# Patient Record
Sex: Female | Born: 1945 | Race: White | Hispanic: No | Marital: Married | Smoking: Never smoker
Health system: Southern US, Community
[De-identification: ages and names within clinical notes are randomized; demographics above are authoritative.]

## PROBLEM LIST (undated history)

## (undated) DIAGNOSIS — E119 Type 2 diabetes mellitus without complications: Secondary | ICD-10-CM

## (undated) DIAGNOSIS — I447 Left bundle-branch block, unspecified: Secondary | ICD-10-CM

## (undated) DIAGNOSIS — I10 Essential (primary) hypertension: Secondary | ICD-10-CM

## (undated) DIAGNOSIS — M8580 Other specified disorders of bone density and structure, unspecified site: Secondary | ICD-10-CM

## (undated) DIAGNOSIS — E785 Hyperlipidemia, unspecified: Secondary | ICD-10-CM

## (undated) DIAGNOSIS — I429 Cardiomyopathy, unspecified: Secondary | ICD-10-CM

## (undated) DIAGNOSIS — Z923 Personal history of irradiation: Secondary | ICD-10-CM

## (undated) DIAGNOSIS — M1990 Unspecified osteoarthritis, unspecified site: Secondary | ICD-10-CM

## (undated) DIAGNOSIS — K649 Unspecified hemorrhoids: Secondary | ICD-10-CM

## (undated) DIAGNOSIS — H3530 Unspecified macular degeneration: Secondary | ICD-10-CM

## (undated) DIAGNOSIS — I2510 Atherosclerotic heart disease of native coronary artery without angina pectoris: Secondary | ICD-10-CM

## (undated) DIAGNOSIS — C50919 Malignant neoplasm of unspecified site of unspecified female breast: Secondary | ICD-10-CM

## (undated) DIAGNOSIS — I509 Heart failure, unspecified: Secondary | ICD-10-CM

## (undated) DIAGNOSIS — K759 Inflammatory liver disease, unspecified: Secondary | ICD-10-CM

## (undated) DIAGNOSIS — R011 Cardiac murmur, unspecified: Secondary | ICD-10-CM

## (undated) DIAGNOSIS — Z95810 Presence of automatic (implantable) cardiac defibrillator: Secondary | ICD-10-CM

## (undated) HISTORY — DX: Left bundle-branch block, unspecified: I44.7

## (undated) HISTORY — PX: ABDOMINAL HYSTERECTOMY: SHX81

## (undated) HISTORY — PX: COLONOSCOPY: SHX174

## (undated) HISTORY — PX: EYE SURGERY: SHX253

## (undated) HISTORY — PX: MEDIAL PARTIAL KNEE REPLACEMENT: SHX5965

## (undated) HISTORY — DX: Essential (primary) hypertension: I10

## (undated) HISTORY — PX: INSERTION OF ICD: SHX6689

## (undated) HISTORY — DX: Type 2 diabetes mellitus without complications: E11.9

---

## 1979-06-25 DIAGNOSIS — K759 Inflammatory liver disease, unspecified: Secondary | ICD-10-CM

## 1979-06-25 DIAGNOSIS — B159 Hepatitis A without hepatic coma: Secondary | ICD-10-CM

## 1979-06-25 HISTORY — DX: Hepatitis a without hepatic coma: B15.9

## 1979-06-25 HISTORY — DX: Inflammatory liver disease, unspecified: K75.9

## 2002-06-24 DIAGNOSIS — C50912 Malignant neoplasm of unspecified site of left female breast: Secondary | ICD-10-CM

## 2002-06-24 DIAGNOSIS — C50919 Malignant neoplasm of unspecified site of unspecified female breast: Secondary | ICD-10-CM

## 2002-06-24 DIAGNOSIS — Z923 Personal history of irradiation: Secondary | ICD-10-CM

## 2002-06-24 HISTORY — PX: BREAST LUMPECTOMY: SHX2

## 2002-06-24 HISTORY — PX: BREAST BIOPSY: SHX20

## 2002-06-24 HISTORY — DX: Personal history of irradiation: Z92.3

## 2002-06-24 HISTORY — DX: Malignant neoplasm of unspecified site of unspecified female breast: C50.919

## 2002-06-24 HISTORY — DX: Malignant neoplasm of unspecified site of left female breast: C50.912

## 2005-05-31 ENCOUNTER — Ambulatory Visit: Payer: Self-pay | Admitting: Unknown Physician Specialty

## 2007-01-16 ENCOUNTER — Ambulatory Visit: Payer: Self-pay | Admitting: Unknown Physician Specialty

## 2007-06-25 DIAGNOSIS — I429 Cardiomyopathy, unspecified: Secondary | ICD-10-CM

## 2007-06-25 HISTORY — DX: Cardiomyopathy, unspecified: I42.9

## 2007-08-03 ENCOUNTER — Ambulatory Visit: Payer: Self-pay | Admitting: Internal Medicine

## 2007-08-03 HISTORY — PX: CARDIAC CATHETERIZATION: SHX172

## 2007-08-07 ENCOUNTER — Ambulatory Visit: Payer: Self-pay | Admitting: Family Medicine

## 2007-09-28 ENCOUNTER — Ambulatory Visit: Payer: Self-pay | Admitting: Unknown Physician Specialty

## 2007-09-28 ENCOUNTER — Other Ambulatory Visit: Payer: Self-pay

## 2007-10-08 ENCOUNTER — Inpatient Hospital Stay: Payer: Self-pay | Admitting: Unknown Physician Specialty

## 2009-02-23 DIAGNOSIS — Z95810 Presence of automatic (implantable) cardiac defibrillator: Secondary | ICD-10-CM

## 2009-02-23 HISTORY — PX: INSERTION OF ICD: SHX6689

## 2009-02-23 HISTORY — DX: Presence of automatic (implantable) cardiac defibrillator: Z95.810

## 2010-08-13 ENCOUNTER — Ambulatory Visit: Payer: Self-pay | Admitting: Family Medicine

## 2010-10-19 ENCOUNTER — Ambulatory Visit: Payer: Self-pay | Admitting: Unknown Physician Specialty

## 2010-10-24 LAB — PATHOLOGY REPORT

## 2011-06-11 ENCOUNTER — Ambulatory Visit: Payer: Self-pay | Admitting: Ophthalmology

## 2011-07-02 ENCOUNTER — Ambulatory Visit: Payer: Self-pay | Admitting: Ophthalmology

## 2011-07-22 ENCOUNTER — Ambulatory Visit: Payer: Self-pay | Admitting: Ophthalmology

## 2011-07-22 LAB — POTASSIUM: Potassium: 3.8 mmol/L (ref 3.5–5.1)

## 2011-07-30 ENCOUNTER — Ambulatory Visit: Payer: Self-pay | Admitting: Ophthalmology

## 2011-09-02 ENCOUNTER — Ambulatory Visit: Payer: Self-pay | Admitting: Family Medicine

## 2013-05-05 ENCOUNTER — Ambulatory Visit: Payer: Self-pay | Admitting: Family Medicine

## 2014-02-08 DIAGNOSIS — E782 Mixed hyperlipidemia: Secondary | ICD-10-CM | POA: Insufficient documentation

## 2014-05-06 ENCOUNTER — Ambulatory Visit: Payer: Self-pay | Admitting: Family Medicine

## 2014-08-06 DIAGNOSIS — R001 Bradycardia, unspecified: Secondary | ICD-10-CM | POA: Insufficient documentation

## 2014-08-16 DIAGNOSIS — R0681 Apnea, not elsewhere classified: Secondary | ICD-10-CM | POA: Insufficient documentation

## 2014-08-16 DIAGNOSIS — I071 Rheumatic tricuspid insufficiency: Secondary | ICD-10-CM | POA: Insufficient documentation

## 2014-10-16 NOTE — Op Note (Signed)
PATIENT NAME:  Teresa Levy, ROCQUE MR#:  [id number] DATE OF BIRTH:  18-Dec-1945  DATE OF PROCEDURE:  07/02/2011  PREOPERATIVE DIAGNOSIS: Visually significant cataract of the left eye.   POSTOPERATIVE DIAGNOSIS: Visually significant cataract of the left eye.   OPERATIVE PROCEDURE: Cataract extraction by phacoemulsification with implant of intraocular lens to left eye.   SURGEON: Birder Robson, MD.   ANESTHESIA:  1. Managed anesthesia care.  2. Topical tetracaine drops followed by 2% Xylocaine jelly applied in the preoperative holding area.   COMPLICATIONS: None.   TECHNIQUE: Stop and chop.   DESCRIPTION OF PROCEDURE: The patient was examined and consented in the preoperative holding area where the aforementioned topical anesthesia was applied to the left eye and then brought back to the Operating Room where the left eye was prepped and draped in the usual sterile ophthalmic fashion and a lid speculum was placed. A paracentesis was created with the side port blade and the anterior chamber was filled with viscoelastic. A near clear corneal incision was performed with the steel keratome. A continuous curvilinear capsulorrhexis was performed with a cystotome followed by the capsulorrhexis forceps. Hydrodissection and hydrodelineation were carried out with BSS on a blunt cannula. The lens was removed in a stop and chop technique and the remaining cortical material was removed with the irrigation-aspiration handpiece. The capsular bag was inflated with viscoelastic and the Technis ZCB00 18.5-diopter lens, serial number [id number] was placed in the capsular bag without complication. The remaining viscoelastic was removed from the eye with the irrigation-aspiration handpiece. The wounds were hydrated. The anterior chamber was flushed with Miostat and the eye was inflated to physiologic pressure. The wounds were found to be water tight. The eye was dressed with Vigamox and Omnipred. The patient was given  protective glasses to wear throughout the day and a shield with which to sleep tonight. The patient was also given drops with which to begin a drop regimen today and will follow-up with me in one day.   ____________________________ Livingston Diones. Darcee Dekker, MD wlp:ap D: 07/02/2011 13:50:39 ET T: 07/02/2011 14:10:17 ET JOB#: [id number]  cc: Winfred Redel L. Eastin Swing, MD, <Dictator> Livingston Diones Soul Deveney MD ELECTRONICALLY SIGNED 07/09/2011 12:18

## 2014-10-16 NOTE — Op Note (Signed)
PATIENT NAME:  Teresa Levy, Teresa Levy MR#:  [id number] DATE OF BIRTH:  09-02-1945  DATE OF PROCEDURE:  07/30/2011  PREOPERATIVE DIAGNOSIS: Visually significant cataract of the right eye.   POSTOPERATIVE DIAGNOSIS: Visually significant cataract of the right eye.   OPERATIVE PROCEDURE: Cataract extraction by phacoemulsification with implant of intraocular lens to the right eye.   SURGEON: Birder Robson, MD.   ANESTHESIA:  1. Managed anesthesia care.  2. Topical tetracaine drops followed by 2% Xylocaine jelly applied in the preoperative holding area.   COMPLICATIONS: None.   TECHNIQUE:  Stop and chop.   DESCRIPTION OF PROCEDURE: The patient was examined and consented in the preoperative holding area where the aforementioned topical anesthesia was applied to the right eye and then brought back to the Operating Room where the right eye was prepped and draped in the usual sterile ophthalmic fashion and a lid speculum was placed. A paracentesis was created with the side port blade and the anterior chamber was filled with viscoelastic. A near clear corneal incision was performed with the steel keratome. A continuous curvilinear capsulorrhexis was performed with a cystotome followed by the capsulorrhexis forceps. Hydrodissection and hydrodelineation were carried out with BSS on a blunt cannula. The lens was removed in a stop and chop technique and the remaining cortical material was removed with the irrigation-aspiration handpiece. The capsular bag was inflated with viscoelastic and the Tecnis ZCB00 18.5-diopter lens, serial number [id number] was placed in the capsular bag without complication. The remaining viscoelastic was removed from the eye with the irrigation-aspiration handpiece. The wounds were hydrated. The anterior chamber was flushed with Miostat and the eye was inflated to physiologic pressure. The wounds were found to be water tight. The eye was dressed with Vigamox and Omnipred. The patient  was given protective glasses to wear throughout the day and a shield with which to sleep tonight. The patient was also given drops with which to begin a drop regimen today and will follow up with me in one day.     ____________________________ Livingston Diones. Demetri Goshert, MD wlp:bjt D: 07/30/2011 13:04:22 ET T: 07/30/2011 13:29:56 ET JOB#: [id number]  cc: Maui Ahart L. Taler Kushner, MD, <Dictator> Livingston Diones Ruthia Person MD ELECTRONICALLY SIGNED 08/06/2011 12:47

## 2014-11-11 ENCOUNTER — Encounter: Payer: Medicare PPO | Attending: Family Medicine | Admitting: *Deleted

## 2014-11-11 ENCOUNTER — Encounter: Payer: Self-pay | Admitting: *Deleted

## 2014-11-11 VITALS — BP 136/78 | Ht 61.0 in | Wt 199.3 lb

## 2014-11-11 DIAGNOSIS — E119 Type 2 diabetes mellitus without complications: Secondary | ICD-10-CM | POA: Diagnosis not present

## 2014-11-11 NOTE — Patient Instructions (Signed)
Check blood sugars 2 x day before breakfast and 2 hrs after supper every day  Exercise: Begin walking for   15  minutes   3  days a week   Eat 3 meals day,   1-2  snacks a day Space meals 4-6 hours apart  Bring blood sugar records to the next appointment/class  Call your doctor for a prescription for:  1. Meter strips (type)  One Touch Verio  checking  2   times per day  2. Lancets (type)  One Touch Delica  checking  2   times per day  Return for appointment/classes on:

## 2014-11-11 NOTE — Progress Notes (Signed)
Diabetes Self-Management Education  Visit Type: First/Initial  Appt. Start Time: 1000 Appt. End Time: 5456  11/11/2014  Teresa Levy, identified by name and date of birth, is a 69 y.o. female with a diagnosis of Diabetes: Type 2.  Other people present during visit:  Spouse  ASSESSMENT  Blood pressure 136/78, height 5\' 1"  (1.549 m), weight 199 lb 4.8 oz (90.402 kg). Body mass index is 37.68 kg/(m^2).  Initial Visit Information:  Are you currently following a meal plan?: Yes What type of meal plan do you follow?: nothing specific but has stopped drinking [employer] sweet tea Are you taking your medications as prescribed?: Yes Are you checking your feet?: No   How often do you need to have someone help you when you read instructions, pamphlets, or other written materials from your doctor or pharmacy?: 1 - Never What is the last grade level you completed in school?: 12 + college  Psychosocial:  Patient Belief/Attitude about Diabetes: Afraid (also reports angry and frustrated) Self-care barriers: None Self-management support: Family, Friends Other persons present: Spouse/SO Patient Concerns: Nutrition/Meal planning, Monitoring, Problem Solving, Glycemic Control, Weight Control, Healthy Lifestyle, Medication Special Needs: None Preferred Learning Style: Auditory, Visual, Hands on Learning Readiness: Ready  Complications:   Last HgB A1C per patient/outside source: 8 mg/dL (10/31/14) How often do you check your blood sugar?: 0 times/day (not testing)  Provided One Touch Verio Meter and instructed on use. BG upon return demonstration was 131 mg/dL fasting. Have you had a dilated eye exam in the past 12 months?: Yes Have you had a dental exam in the past 12 months?: Yes  Diet Intake:  Breakfast: skipping breakfast - just drinking coffee  Exercise:  Exercise: ADL's  Individualized Plan for Diabetes Self-Management Training:   Learning Objective:  Patient will have a  greater understanding of diabetes self-management.   Education Topics Reviewed with Patient Today:  Definition of diabetes, type 1 and 2, and the diagnosis of diabetes, Factors that contribute to the development of diabetes Role of diet in the treatment of diabetes and the relationship between the three main macronutrients and blood glucose level, Food label reading, portion sizes and measuring food. Role of exercise on diabetes management, blood pressure control and cardiac health. Reviewed patients medication for diabetes, action, purpose, timing of dose and side effects. (Metformin) Taught/evaluated SMBG meter., Purpose and frequency of SMBG., Identified appropriate SMBG and/or A1C goals.   Relationship between chronic complications and blood glucose control Identified and addressed patients feelings and concerns about diabetes (Pt was crying during assessment when checking her blood sugar)      PATIENTS GOALS/Plan (Developed by the patient): Improve blood sugars Prevent diabetes complications Lose weight Lead a healthier lifestyle Stay off insulin shots  Plan:   Patient Instructions  Check blood sugars 2 x day before breakfast and 2 hrs after supper every day  Exercise: Begin walking for   15  minutes   3  days a week   Eat 3 meals day,   1-2  snacks a day Space meals 4-6 hours apart Don't skip meals  Bring blood sugar records to the next appointment/class  Call your doctor for a prescription for:  1. Meter strips (type)  One Touch Verio  checking  2   times per day  2. Lancets (type)  One Touch Delica  checking  2   times per day  Expected Outcomes:  Demonstrated interest in learning. Expect positive outcomes  Education material provided: General Meal Planning Guidelines;  One Touch Verio Flex meter  If problems or questions, patient to contact team via:  Johny Drilling, [place], [place], CDE [phone]  Future DSME appointment: June 2 for Class 1

## 2014-11-14 ENCOUNTER — Telehealth: Payer: Self-pay | Admitting: *Deleted

## 2014-11-14 NOTE — Telephone Encounter (Signed)
Received call from patient. She reports that her insurance will not cover the glucometer that she was given. Pt will come in tomorrow at 3:15 pm to receive new glucometer.

## 2014-11-15 ENCOUNTER — Encounter: Payer: Self-pay | Admitting: *Deleted

## 2014-11-15 NOTE — Progress Notes (Signed)
Pt came by to pick up another glucometer. Her insurance doesn't cover One Touch. Gave her Accu-Check Nano and demonstrated use.

## 2014-11-24 ENCOUNTER — Encounter: Payer: Medicare PPO | Attending: Family Medicine | Admitting: Dietician

## 2014-11-24 VITALS — Ht 61.0 in | Wt 198.0 lb

## 2014-11-24 DIAGNOSIS — E119 Type 2 diabetes mellitus without complications: Secondary | ICD-10-CM | POA: Insufficient documentation

## 2014-11-24 DIAGNOSIS — Z87898 Personal history of other specified conditions: Secondary | ICD-10-CM | POA: Insufficient documentation

## 2014-11-24 DIAGNOSIS — H35349 Macular cyst, hole, or pseudohole, unspecified eye: Secondary | ICD-10-CM | POA: Insufficient documentation

## 2014-11-24 DIAGNOSIS — I10 Essential (primary) hypertension: Secondary | ICD-10-CM | POA: Insufficient documentation

## 2014-11-24 NOTE — Progress Notes (Signed)

## 2014-12-01 ENCOUNTER — Encounter: Payer: Self-pay | Admitting: *Deleted

## 2014-12-01 ENCOUNTER — Encounter: Payer: Medicare PPO | Admitting: *Deleted

## 2014-12-01 VITALS — Wt 195.2 lb

## 2014-12-01 DIAGNOSIS — E119 Type 2 diabetes mellitus without complications: Secondary | ICD-10-CM

## 2014-12-01 NOTE — Progress Notes (Signed)

## 2014-12-08 ENCOUNTER — Encounter: Payer: Medicare PPO | Admitting: Dietician

## 2014-12-08 VITALS — BP 116/64 | Ht 61.0 in | Wt 195.4 lb

## 2014-12-08 DIAGNOSIS — E119 Type 2 diabetes mellitus without complications: Secondary | ICD-10-CM | POA: Diagnosis not present

## 2014-12-08 NOTE — Progress Notes (Signed)

## 2014-12-12 ENCOUNTER — Encounter: Payer: Self-pay | Admitting: *Deleted

## 2015-01-30 DIAGNOSIS — E119 Type 2 diabetes mellitus without complications: Secondary | ICD-10-CM | POA: Insufficient documentation

## 2015-02-08 ENCOUNTER — Other Ambulatory Visit: Payer: Self-pay | Admitting: Family Medicine

## 2015-02-08 DIAGNOSIS — Z1239 Encounter for other screening for malignant neoplasm of breast: Secondary | ICD-10-CM

## 2015-04-17 DIAGNOSIS — Z95810 Presence of automatic (implantable) cardiac defibrillator: Secondary | ICD-10-CM | POA: Insufficient documentation

## 2015-05-04 ENCOUNTER — Ambulatory Visit: Admission: RE | Admit: 2015-05-04 | Payer: Medicare PPO | Source: Ambulatory Visit

## 2015-05-04 ENCOUNTER — Encounter
Admission: RE | Admit: 2015-05-04 | Discharge: 2015-05-04 | Disposition: A | Discharge status: Home / Self Care | Payer: Medicare PPO | Source: Ambulatory Visit | Attending: Cardiology | Admitting: Cardiology

## 2015-05-04 DIAGNOSIS — Z01812 Encounter for preprocedural laboratory examination: Secondary | ICD-10-CM | POA: Insufficient documentation

## 2015-05-04 DIAGNOSIS — Z01810 Encounter for preprocedural cardiovascular examination: Secondary | ICD-10-CM | POA: Insufficient documentation

## 2015-05-04 DIAGNOSIS — Z419 Encounter for procedure for purposes other than remedying health state, unspecified: Secondary | ICD-10-CM

## 2015-05-04 HISTORY — DX: Inflammatory liver disease, unspecified: K75.9

## 2015-05-04 HISTORY — DX: Atherosclerotic heart disease of native coronary artery without angina pectoris: I25.10

## 2015-05-04 HISTORY — DX: Hyperlipidemia, unspecified: E78.5

## 2015-05-04 HISTORY — DX: Other specified disorders of bone density and structure, unspecified site: M85.80

## 2015-05-04 LAB — BASIC METABOLIC PANEL
Anion gap: 7 (ref 5–15)
BUN: 15 mg/dL (ref 6–20)
CHLORIDE: 105 mmol/L (ref 101–111)
CO2: 29 mmol/L (ref 22–32)
CREATININE: 0.64 mg/dL (ref 0.44–1.00)
Calcium: 9.5 mg/dL (ref 8.9–10.3)
GLUCOSE: 100 mg/dL — AB (ref 65–99)
POTASSIUM: 3.8 mmol/L (ref 3.5–5.1)
Sodium: 141 mmol/L (ref 135–145)

## 2015-05-04 LAB — CBC
HEMATOCRIT: 39.5 % (ref 35.0–47.0)
Hemoglobin: 13.2 g/dL (ref 12.0–16.0)
MCH: 28.5 pg (ref 26.0–34.0)
MCHC: 33.3 g/dL (ref 32.0–36.0)
MCV: 85.5 fL (ref 80.0–100.0)
Platelets: 181 10*3/uL (ref 150–440)
RBC: 4.62 MIL/uL (ref 3.80–5.20)
RDW: 13.5 % (ref 11.5–14.5)
WBC: 5.1 10*3/uL (ref 3.6–11.0)

## 2015-05-04 LAB — DIFFERENTIAL
BASOS PCT: 1 %
Basophils Absolute: 0 10*3/uL (ref 0–0.1)
EOS ABS: 0.1 10*3/uL (ref 0–0.7)
EOS PCT: 2 %
LYMPHS PCT: 35 %
Lymphs Abs: 1.8 10*3/uL (ref 1.0–3.6)
Monocytes Absolute: 0.4 10*3/uL (ref 0.2–0.9)
Monocytes Relative: 7 %
NEUTROS PCT: 55 %
Neutro Abs: 2.8 10*3/uL (ref 1.4–6.5)

## 2015-05-04 LAB — SURGICAL PCR SCREEN
MRSA, PCR: NEGATIVE
STAPHYLOCOCCUS AUREUS: POSITIVE — AB

## 2015-05-04 NOTE — OR Nursing (Signed)
Staph + and MRSA - Faxed notification and called office

## 2015-05-04 NOTE — Patient Instructions (Signed)
  Your procedure is scheduled on: 05/23/15 Report to Day Surgery. To find out your arrival time please call [phone] between 1PM - 3PM on 05/22/15.  Remember: Instructions that are not followed completely may result in serious medical risk, up to and including death, or upon the discretion of your surgeon and anesthesiologist your surgery may need to be rescheduled.    __x__ 1. Do not eat food or drink liquids after midnight. No gum chewing or hard candies.     __x__ 2. No Alcohol for 24 hours before or after surgery.   ____ 3. Bring all medications with you on the day of surgery if instructed.    ___x_ 4. Notify your doctor if there is any change in your medical condition     (cold, fever, infections).     Do not wear jewelry, make-up, hairpins, clips or nail polish.  Do not wear lotions, powders, or perfumes. You may wear deodorant.  Do not shave 48 hours prior to surgery. Men may shave face and neck.  Do not bring valuables to the hospital.    [facility] is not responsible for any belongings or valuables.               Contacts, dentures or bridgework may not be worn into surgery.  Leave your suitcase in the car. After surgery it may be brought to your room.  For patients admitted to the hospital, discharge time is determined by your                treatment team.   Patients discharged the day of surgery will not be allowed to drive home.   Please read over the following fact sheets that you were given:   Surgical Site Infection Prevention   ____ Take these medicines the morning of surgery with A SIP OF WATER:    1. coreg  2. Bring lisinopril/hctz  3.   4.  5.  6.  ____ Fleet Enema (as directed)   _x___ Use CHG Soap as directed  ____ Use inhalers on the day of surgery  __x__ Stop metformin 2 days prior to surgery 11/26    ____ Take 1/2 of usual insulin dose the night before surgery and none on the morning of surgery.   ____ Stop Coumadin/Plavix/aspirin on as  per Dr. Lillie Fragmin  ____ Stop Anti-inflammatories on 11/22  May take tylenol   ____ Stop supplements until after surgery.  Glucosamine 11/22  ____ Bring C-Pap to the hospital.

## 2015-05-04 NOTE — OR Nursing (Signed)
Dr. Lillie Fragmin office said patient should stop ASA 81mg  7 days before the surgery

## 2015-05-08 ENCOUNTER — Ambulatory Visit
Admission: RE | Admit: 2015-05-08 | Discharge: 2015-05-08 | Disposition: A | Discharge status: Home / Self Care | Payer: Medicare PPO | Source: Ambulatory Visit | Attending: Cardiology | Admitting: Cardiology

## 2015-05-08 ENCOUNTER — Ambulatory Visit
Admission: RE | Admit: 2015-05-08 | Discharge: 2015-05-08 | Disposition: A | Discharge status: Home / Self Care | Payer: Medicare PPO | Source: Ambulatory Visit | Attending: Family Medicine | Admitting: Family Medicine

## 2015-05-08 ENCOUNTER — Other Ambulatory Visit: Payer: Self-pay | Admitting: Cardiology

## 2015-05-08 DIAGNOSIS — Z01818 Encounter for other preprocedural examination: Secondary | ICD-10-CM | POA: Diagnosis not present

## 2015-05-08 DIAGNOSIS — Z1239 Encounter for other screening for malignant neoplasm of breast: Secondary | ICD-10-CM

## 2015-05-08 DIAGNOSIS — Z1231 Encounter for screening mammogram for malignant neoplasm of breast: Secondary | ICD-10-CM | POA: Diagnosis not present

## 2015-05-08 DIAGNOSIS — Z419 Encounter for procedure for purposes other than remedying health state, unspecified: Secondary | ICD-10-CM

## 2015-05-08 HISTORY — DX: Malignant neoplasm of unspecified site of unspecified female breast: C50.919

## 2015-05-23 ENCOUNTER — Encounter: Payer: Self-pay | Admitting: *Deleted

## 2015-05-23 ENCOUNTER — Encounter
Admission: RE | Disposition: A | Discharge status: Home / Self Care | Payer: Self-pay | Source: Ambulatory Visit | Attending: Cardiology

## 2015-05-23 ENCOUNTER — Ambulatory Visit: Payer: Medicare PPO | Admitting: Certified Registered"

## 2015-05-23 ENCOUNTER — Ambulatory Visit
Admission: RE | Admit: 2015-05-23 | Discharge: 2015-05-23 | Disposition: A | Discharge status: Home / Self Care | Payer: Medicare PPO | Source: Ambulatory Visit | Attending: Cardiology | Admitting: Cardiology

## 2015-05-23 DIAGNOSIS — I5022 Chronic systolic (congestive) heart failure: Secondary | ICD-10-CM | POA: Diagnosis not present

## 2015-05-23 DIAGNOSIS — I2510 Atherosclerotic heart disease of native coronary artery without angina pectoris: Secondary | ICD-10-CM | POA: Diagnosis not present

## 2015-05-23 DIAGNOSIS — Z853 Personal history of malignant neoplasm of breast: Secondary | ICD-10-CM | POA: Insufficient documentation

## 2015-05-23 DIAGNOSIS — I071 Rheumatic tricuspid insufficiency: Secondary | ICD-10-CM | POA: Insufficient documentation

## 2015-05-23 DIAGNOSIS — Z833 Family history of diabetes mellitus: Secondary | ICD-10-CM | POA: Insufficient documentation

## 2015-05-23 DIAGNOSIS — Z803 Family history of malignant neoplasm of breast: Secondary | ICD-10-CM | POA: Insufficient documentation

## 2015-05-23 DIAGNOSIS — H35343 Macular cyst, hole, or pseudohole, bilateral: Secondary | ICD-10-CM | POA: Insufficient documentation

## 2015-05-23 DIAGNOSIS — Z8719 Personal history of other diseases of the digestive system: Secondary | ICD-10-CM | POA: Insufficient documentation

## 2015-05-23 DIAGNOSIS — R0602 Shortness of breath: Secondary | ICD-10-CM | POA: Insufficient documentation

## 2015-05-23 DIAGNOSIS — Z4502 Encounter for adjustment and management of automatic implantable cardiac defibrillator: Secondary | ICD-10-CM | POA: Diagnosis not present

## 2015-05-23 DIAGNOSIS — Z7982 Long term (current) use of aspirin: Secondary | ICD-10-CM | POA: Insufficient documentation

## 2015-05-23 DIAGNOSIS — Z820 Family history of epilepsy and other diseases of the nervous system: Secondary | ICD-10-CM | POA: Insufficient documentation

## 2015-05-23 DIAGNOSIS — I10 Essential (primary) hypertension: Secondary | ICD-10-CM | POA: Insufficient documentation

## 2015-05-23 DIAGNOSIS — M8580 Other specified disorders of bone density and structure, unspecified site: Secondary | ICD-10-CM | POA: Diagnosis not present

## 2015-05-23 DIAGNOSIS — Z96652 Presence of left artificial knee joint: Secondary | ICD-10-CM | POA: Insufficient documentation

## 2015-05-23 DIAGNOSIS — Z7984 Long term (current) use of oral hypoglycemic drugs: Secondary | ICD-10-CM | POA: Diagnosis not present

## 2015-05-23 DIAGNOSIS — Z9849 Cataract extraction status, unspecified eye: Secondary | ICD-10-CM | POA: Diagnosis not present

## 2015-05-23 DIAGNOSIS — I447 Left bundle-branch block, unspecified: Secondary | ICD-10-CM | POA: Diagnosis not present

## 2015-05-23 DIAGNOSIS — Z823 Family history of stroke: Secondary | ICD-10-CM | POA: Insufficient documentation

## 2015-05-23 DIAGNOSIS — Z8249 Family history of ischemic heart disease and other diseases of the circulatory system: Secondary | ICD-10-CM | POA: Diagnosis not present

## 2015-05-23 DIAGNOSIS — Z881 Allergy status to other antibiotic agents status: Secondary | ICD-10-CM | POA: Insufficient documentation

## 2015-05-23 DIAGNOSIS — I442 Atrioventricular block, complete: Secondary | ICD-10-CM | POA: Diagnosis present

## 2015-05-23 DIAGNOSIS — Z79899 Other long term (current) drug therapy: Secondary | ICD-10-CM | POA: Insufficient documentation

## 2015-05-23 DIAGNOSIS — I429 Cardiomyopathy, unspecified: Secondary | ICD-10-CM | POA: Diagnosis not present

## 2015-05-23 DIAGNOSIS — Z800 Family history of malignant neoplasm of digestive organs: Secondary | ICD-10-CM | POA: Insufficient documentation

## 2015-05-23 DIAGNOSIS — E785 Hyperlipidemia, unspecified: Secondary | ICD-10-CM | POA: Insufficient documentation

## 2015-05-23 DIAGNOSIS — J309 Allergic rhinitis, unspecified: Secondary | ICD-10-CM | POA: Diagnosis not present

## 2015-05-23 DIAGNOSIS — Z8262 Family history of osteoporosis: Secondary | ICD-10-CM | POA: Insufficient documentation

## 2015-05-23 HISTORY — PX: IMPLANTABLE CARDIOVERTER DEFIBRILLATOR (ICD) GENERATOR CHANGE: SHX5469

## 2015-05-23 LAB — GLUCOSE, CAPILLARY
GLUCOSE-CAPILLARY: 107 mg/dL — AB (ref 65–99)
GLUCOSE-CAPILLARY: 102 mg/dL — AB (ref 65–99)

## 2015-05-23 SURGERY — ICD GENERATOR CHANGE
Anesthesia: General | Laterality: Left

## 2015-05-23 MED ORDER — SODIUM CHLORIDE 0.9 % IR SOLN: Freq: Once | Status: DC

## 2015-05-23 MED ORDER — GLYCOPYRROLATE 0.2 MG/ML IJ SOLN
INTRAMUSCULAR | Status: DC | PRN
  Administered 2015-05-23: 0.2 mg via INTRAVENOUS

## 2015-05-23 MED ORDER — GENTAMICIN SULFATE 40 MG/ML IJ SOLN
INTRAMUSCULAR | Status: AC
  Filled 2015-05-23: qty 2

## 2015-05-23 MED ORDER — ONDANSETRON HCL 4 MG/2ML IJ SOLN: 4.0000 mg | Freq: Once | INTRAMUSCULAR | Status: DC | PRN

## 2015-05-23 MED ORDER — PROPOFOL 500 MG/50ML IV EMUL
INTRAVENOUS | Status: DC | PRN
  Administered 2015-05-23: 50 ug/kg/min via INTRAVENOUS

## 2015-05-23 MED ORDER — FAMOTIDINE 20 MG PO TABS
20.0000 mg | ORAL_TABLET | Freq: Once | ORAL | Status: AC
Start: 2015-05-23 — End: 2015-05-23
  Administered 2015-05-23: 20 mg via ORAL

## 2015-05-23 MED ORDER — FENTANYL CITRATE (PF) 100 MCG/2ML IJ SOLN
INTRAMUSCULAR | Status: DC | PRN
  Administered 2015-05-23 (×2): 50 ug via INTRAVENOUS

## 2015-05-23 MED ORDER — SODIUM CHLORIDE 0.9 % IV SOLN
INTRAVENOUS | Status: DC
  Administered 2015-05-23: 11:00:00 via INTRAVENOUS

## 2015-05-23 MED ORDER — KETAMINE HCL 10 MG/ML IJ SOLN
INTRAMUSCULAR | Status: DC | PRN
  Administered 2015-05-23: 20 mg via INTRAVENOUS

## 2015-05-23 MED ORDER — CEFAZOLIN SODIUM 1-5 GM-% IV SOLN
1.0000 g | Freq: Once | INTRAVENOUS | Status: AC
  Administered 2015-05-23: 1 g via INTRAVENOUS

## 2015-05-23 MED ORDER — LIDOCAINE 1 % OPTIME INJ - NO CHARGE
INTRAMUSCULAR | Status: DC | PRN
  Administered 2015-05-23: 30 mL

## 2015-05-23 MED ORDER — CEPHALEXIN 250 MG PO CAPS: 250.0000 mg | ORAL_CAPSULE | Freq: Four times a day (QID) | ORAL | Status: DC

## 2015-05-23 MED ORDER — LIDOCAINE HCL (CARDIAC) 20 MG/ML IV SOLN
INTRAVENOUS | Status: DC | PRN
  Administered 2015-05-23: 50 mg via INTRAVENOUS

## 2015-05-23 MED ORDER — FENTANYL CITRATE (PF) 100 MCG/2ML IJ SOLN: 25.0000 ug | INTRAMUSCULAR | Status: DC | PRN

## 2015-05-23 MED ORDER — MIDAZOLAM HCL 5 MG/5ML IJ SOLN
INTRAMUSCULAR | Status: DC | PRN
  Administered 2015-05-23: 2 mg via INTRAVENOUS

## 2015-05-23 SURGICAL SUPPLY — 44 items
BAG DECANTER FOR FLEXI CONT (MISCELLANEOUS) ×3 IMPLANT
BLADE SURG SZ10 CARB STEEL (BLADE) ×3 IMPLANT
CANISTER SUCT 1200ML W/VALVE (MISCELLANEOUS) ×3 IMPLANT
CHLORAPREP W/TINT 26ML (MISCELLANEOUS) ×3 IMPLANT
CLOSURE WOUND 1/2 X4 (GAUZE/BANDAGES/DRESSINGS) ×1
COVER LIGHT HANDLE STERIS (MISCELLANEOUS) ×6 IMPLANT
DEVICE DISSECT PLASMABLAD 3.0S (MISCELLANEOUS) ×1 IMPLANT
DRAPE C-ARM XRAY 36X54 (DRAPES) ×3 IMPLANT
DRAPE INCISE IOBAN 66X45 STRL (DRAPES) ×3 IMPLANT
DRAPE LAPAROTOMY 77X122 PED (DRAPES) ×3 IMPLANT
DRSG TEGADERM 4X4.75 (GAUZE/BANDAGES/DRESSINGS) ×3 IMPLANT
DRSG TEGADERM 6X8 (GAUZE/BANDAGES/DRESSINGS) ×3 IMPLANT
GLOVE BIO SURGEON STRL SZ8 (GLOVE) ×3 IMPLANT
GOWN STRL REUS W/ TWL LRG LVL3 (GOWN DISPOSABLE) ×1 IMPLANT
GOWN STRL REUS W/ TWL XL LVL3 (GOWN DISPOSABLE) ×1 IMPLANT
GOWN STRL REUS W/TWL LRG LVL3 (GOWN DISPOSABLE) ×2
GOWN STRL REUS W/TWL XL LVL3 (GOWN DISPOSABLE) ×2
GRADUATE 1200CC STRL 31836 (MISCELLANEOUS) ×3 IMPLANT
ICD VIVA XT CRT-D DTBA1D1 (ICD Generator) ×3 IMPLANT
IV NS 1000ML (IV SOLUTION) ×2
IV NS 1000ML BAXH (IV SOLUTION) ×1 IMPLANT
KIT RM TURNOVER STRD PROC AR (KITS) ×3 IMPLANT
KIT WRENCH (KITS) ×3 IMPLANT
NEEDLE FILTER BLUNT 18X 1/2SAF (NEEDLE) ×2
NEEDLE FILTER BLUNT 18X1 1/2 (NEEDLE) ×1 IMPLANT
NEEDLE HYPO 25X1 1.5 SAFETY (NEEDLE) ×3 IMPLANT
NEEDLE SPNL 22GX3.5 QUINCKE BK (NEEDLE) ×3 IMPLANT
NS IRRIG 500ML POUR BTL (IV SOLUTION) ×3 IMPLANT
PACK BASIN MINOR ARMC (MISCELLANEOUS) ×3 IMPLANT
PACK PACE INSERTION (MISCELLANEOUS) ×3 IMPLANT
PAD GROUND ADULT SPLIT (MISCELLANEOUS) ×3 IMPLANT
PAD STATPAD (MISCELLANEOUS) ×3 IMPLANT
PLASMABLADE 3.0S (MISCELLANEOUS) ×3
STRAP SAFETY BODY (MISCELLANEOUS) ×3 IMPLANT
STRIP CLOSURE SKIN 1/2X4 (GAUZE/BANDAGES/DRESSINGS) ×2 IMPLANT
SUT SILK 2 0 SH (SUTURE) ×3 IMPLANT
SUT VIC AB 2-0 CT1 27 (SUTURE) ×2
SUT VIC AB 2-0 CT1 TAPERPNT 27 (SUTURE) ×1 IMPLANT
SUT VIC AB 2-0 CT2 27 (SUTURE) ×3 IMPLANT
SUT VIC AB 3-0 PS2 18 (SUTURE) ×3 IMPLANT
SUT VIC AB 4-0 PS2 18 (SUTURE) ×3 IMPLANT
SYR BULB IRRIG 60ML STRL (SYRINGE) ×3 IMPLANT
SYR CONTROL 10ML (SYRINGE) ×3 IMPLANT
SYRINGE 10CC LL (SYRINGE) ×3 IMPLANT

## 2015-05-23 NOTE — Anesthesia Preprocedure Evaluation (Signed)
Anesthesia Evaluation  Patient identified by MRN, date of birth, ID band Patient awake    Reviewed: Allergy & Precautions, NPO status , Patient's Chart, lab work & pertinent test results, reviewed documented beta blocker date and time   Airway Mallampati: II  TM Distance: >3 FB     Dental  (+) Chipped   Pulmonary shortness of breath,           Cardiovascular hypertension, Pt. on medications and Pt. on home beta blockers + CAD  + dysrhythmias      Neuro/Psych    GI/Hepatic (+) Hepatitis -  Endo/Other    Renal/GU      Musculoskeletal   Abdominal   Peds  Hematology   Anesthesia Other Findings   Reproductive/Obstetrics                             Anesthesia Physical Anesthesia Plan  ASA: III  Anesthesia Plan: MAC   Post-op Pain Management:    Induction:   Airway Management Planned:   Additional Equipment:   Intra-op Plan:   Post-operative Plan:   Informed Consent: I have reviewed the patients History and Physical, chart, labs and discussed the procedure including the risks, benefits and alternatives for the proposed anesthesia with the patient or authorized representative who has indicated his/her understanding and acceptance.     Plan Discussed with: CRNA  Anesthesia Plan Comments:         Anesthesia Quick Evaluation

## 2015-05-23 NOTE — Transfer of Care (Signed)
Immediate Anesthesia Transfer of Care Note  Patient: Teresa Levy  Procedure(s) Performed: Procedure(s): ICD GENERATOR CHANGE (Left)  Patient Location: PACU  Anesthesia Type:General  Level of Consciousness: awake and alert   Airway & Oxygen Therapy: Patient Spontanous Breathing  Post-op Assessment: Report given to RN  Post vital signs: Reviewed  Last Vitals:  Filed Vitals:   05/23/15 1058 05/23/15 1340  BP: 152/92 125/85  Pulse: 68 67  Temp: 35.6 C 36.4 C  Resp: 16 16    Complications: none

## 2015-05-23 NOTE — Op Note (Signed)
[facility]   05/23/2015                     1:39 PM  PATIENT:  [name]    PRE-OPERATIVE DIAGNOSIS:  COMPLETE HEART BLOCK  POST-OPERATIVE DIAGNOSIS:  Same  PROCEDURE:  ICD GENERATOR CHANGE  SURGEON:  Isaias Cowman, MD    ANESTHESIA:     PREOPERATIVE INDICATIONS:  Teresa Levy is a  69 y.o. female with a diagnosis of [place] who failed conservative measures and elected for surgical management.    The risks benefits and alternatives were discussed with the patient preoperatively including but not limited to the risks of infection, bleeding, cardiopulmonary complications, the need for revision surgery, among others, and the patient was willing to proceed.   OPERATIVE PROCEDURE: The patient was brought to the operating room the fasting state. The left pectoral region was prepped and draped in usual sterile manner. A 6 cm incision was performed over the old ICD site. The ICD generator was retrieved by electrocautery and blunt dissection. Leads were disconnected and elected to a new the ICD device ( Viva XT CRT-D ), and positioned into the pocket. Pocket was closed with 20 and 4-0 Vicryl, respectively. Steri-Strips and a pressure dressing were applied.

## 2015-05-23 NOTE — Discharge Instructions (Signed)
AMBULATORY SURGERY  DISCHARGE INSTRUCTIONS   1) The drugs that you were given will stay in your system until tomorrow so for the next 24 hours you should not:  A) Drive an automobile B) Make any legal decisions C) Drink any alcoholic beverage   2) You may resume regular meals tomorrow.  Today it is better to start with liquids and gradually work up to solid foods.  You may eat anything you prefer, but it is better to start with liquids, then soup and crackers, and gradually work up to solid foods.   3) Please notify your doctor immediately if you have any unusual bleeding, trouble breathing, redness and pain at the surgery site, drainage, fever, or pain not relieved by medication. 4)   5) Your post-operative visit with Dr.                                     is: Date:                        Time:    Please call to schedule your post-operative visit.  6) Additional Instructions:      Cardioverter Defibrillator Implantation, Care After Refer to this sheet in the next few weeks. These instructions provide you with information on caring for yourself after your procedure. Your health care provider may also give you more specific instructions. Your treatment has been planned according to current medical practices, but problems sometimes occur. Call your health care provider if you have any problems or questions after your procedure.  WHAT TO EXPECT AFTER THE PROCEDURE You may feel pain. Some pain is normal. It may last a few days. A slight bump may be seen over the skin where the device was placed. Sometimes, it is possible to feel the device under the skin. This is normal. In the months and years afterward, your health care provider will check the device, the leads, and the battery every few months. Eventually, when the battery is low, the device will be replaced. HOME CARE INSTRUCTIONS  Medicines Take medicines only as directed by your health care provider. If you were prescribed  an antibiotic medicine, finish it all even if you start to feel better.  Do not take any other medicines without asking your health care provider first. Some medicines, including certain painkillers, can cause bleeding after surgery.  Wound Care Do not remove the bandage on your chest until directed to do so by your health care provider. Once your bandage is removed, you may see pieces of tape called skin adhesive strips over the area where the cut was made (incision site). Let them fall off on their own.  Check the incision site every day to make sure it is not infected, bleeding, or starting to pull apart. Do not use lotions or ointments near the incision site unless directed to do so.  Keep the incision area clean and dry for 2-3 days after the procedure or as directed by your health care provider. It takes several weeks for the incision site to completely heal.  Do not take baths, swim, or use a hot tub until your health care provider approves. Activities Try to walk a little every day. Exercising is important after this procedure. It is also important to use your shoulder on the side of the pacemaker in daily tasks that do not require exaggerated motion. Avoid sudden  jerking, pulling, or chopping movements that pull your upper arm far away from your body for at least 6 weeks. Do not lift your upper arm above your shoulders for at least 6 weeks. This means no tennis, golf, or swimming for this period of time. If you sleep with the arm above your head, use a restraint to prevent this from happening as you sleep. You may go back to work when your health care provider says it is okay. Check with your health care provider before you start to drive or play sports.  Other Instructions Follow diet instructions if they were provided. You should be able to eat what you usually do right away, but you may need to limit your salt intake.  Weigh yourself every day. If you suddenly gain weight, fluid  may be building up in your body.  Always carry your pacemaker identification card with you. The card should list the implant date, device model, and manufacturer. Consider wearing a medical alert bracelet or necklace. Tell all health care providers that you have a pacemaker. This may prevent them from giving you a magnetic resonance imaging (MRI) scan because of the strong magnets used during that test. If you must pass through a metal detector, quickly walk through it. Do not stop under the detector or stand near it. Avoid places or objects with a strong electric or magnetic field, including:  Engineer, maintenance. When at the airport, let officials know you have a pacemaker. Your ID card will let you be checked in a way that is safe for you and that will not damage your pacemaker. Also, do not let a security person wave a magnetic wand near your pacemaker. That can make it stop working. Power plants.  Large electrical generators.  Radiofrequency transmission towers, such as cell phone and radio towers.  Do not use amateur (ham) radio equipment or electric (arc) welding torches. Some devices are safe to use if held at least 1 foot from your pacemaker. These include power tools, lawn mowers, and speakers. If you are unsure of whether something is safe to use, ask your health care provider.  You may safely use electric blankets, heating pads, computers, and microwave ovens.  When using your cell phone, hold it to the ear opposite the pacemaker. Do not leave your cell phone in a pocket over the pacemaker.  Keep all follow-up visits as directed by your health care provider. This is how your health care provider makes sure your chest is healing the way it should. Ask your health care provider when you should come back to have your stitches or staples taken out.  Have your pacemaker checked every 3-6 months or as directed by your health care provider. Most pacemakers last for 4-8 years before a  new one is needed. SEEK MEDICAL CARE IF:  You feel one shock in your chest. You gain weight suddenly.  Your legs or feet swell more than they have before.  It feels like your heart is fluttering or skipping beats (heart palpitations). You have a fever. SEEK IMMEDIATE MEDICAL CARE IF:  You have chest pain. You feel more than one shock. You feel more short of breath than you have felt before. You feel more light-headed than you have felt before. You have problems with your incision site, such as swelling or bleeding, or it starts to open up.  You notice signs of infection around your incision site. Watch for:  Warmth.  Redness.  Worsening pain.  Swelling.  Fluid leaking from the incision site.    This information is not intended to replace advice given to you by your health care provider. Make sure you discuss any questions you have with your health care provider.   Document Released: 12/28/2004 Document Revised: 07/01/2014 Document Reviewed: 07/01/2012 Elsevier Interactive Patient Education [employer].

## 2015-05-23 NOTE — Interval H&P Note (Signed)
History and Physical Interval Note:  05/23/2015 10:17 AM  Teresa Levy  has presented today for surgery, with the diagnosis of COMPLETE HEART BLOCK  The various methods of treatment have been discussed with the patient and family. After consideration of risks, benefits and other options for treatment, the patient has consented to  Procedure(s): ICD GENERATOR CHANGE (Left) as a surgical intervention .  The patient's history has been reviewed, patient examined, no change in status, stable for surgery.  I have reviewed the patient's chart and labs.  Questions were answered to the patient's satisfaction.     Markiah Janeway

## 2015-05-23 NOTE — H&P (Signed)
Printout Information Document Contents Office Visit Document Received Date 05/08/2015 Document Source Organization [facility] Patient Demographics  Reason for Visit  Encounter Details  Last Filed Vital Signs (In this Visit)  Progress Notes  Plan of Care  Visit Diagnoses  Document Information Encounter Summary - Teresa Levy [id number] y.o. Female)As of Nov. 10, 2016 Patient Demographics Patient Address Communication Language Race / Ethnicity  [address] [place], [place] [address]  [phone] [facility]) [phone] (Home) [email or web address]  English (Preferred) Caucasian/White / Not Hispanic/Latino  Reason for Visit Reason Comments  Follow-up   Pacer-ICD eri   Cardiomyopathy   Encounter Details Date Type Department Care Team Description  05/04/2015 Office Visit [facility]  [place], [place] [address]  [phone]  Teresa Levy, [place]  [address]  [place], [place] [address]  [phone]  [phone] (Fax)  Chronic systolic CHF (congestive heart failure), NYHA class 2 (Primary Dx);Moderate tricuspid insufficiency;Benign essential hypertension  Last Filed Vital Signs (In this Visit) Vital Sign Reading Time Taken  Blood Pressure 120/80 05/04/2015 10:01 AM EST  Pulse - -  Temperature - -  Respiratory Rate 15 05/04/2015 10:01 AM EST  Oxygen Saturation - -  Weight 85.7 kg (189 lb) 05/04/2015 10:01 AM EST  Height 154.9 cm (5\' 1" ) 05/04/2015 10:01 AM EST  Body Mass Index 35.71 05/04/2015 10:01 AM EST  Progress Notes Teresa Dibble, MD - 05/04/2015 9:15 AM EST Formatting of this note may be different from the original. Established Patient Visit   Chief Complaint: Chief Complaint  Patient presents with  . Follow-up  . Pacer-ICD  eri  . Cardiomyopathy  Date of Service: 05/04/2015 Date of Birth: Oct 04, 1945 PCP: Teresa Brandy Hale, MD  History  of Present Illness: Teresa Levy is a 69 y.o.female patient  Tricuspid valve insufficiency The patient has a diagnosis of moderate non rheumatic tricuspid valve insufficiency made by echocardiogram recently. This has been associated with symptoms including shortness of breath and has occurred during climbing stairs stable at this time.There has not been associated pulmonary hypertension and has not been associated extremity edema. Etiology of the tricuspid regurgitation includes congestive heart failure, pacer wire and atrial enlargement. Current treatment includes diuretics, b-blockers and antihypertensives  Congestive heart failure The patient has chronic mild systolic heart failure secondary to Non-ischemic. Congestive Heart Failure has been manifested by severe exercise intolerance with a [place] Functional Class of II. and exertional chest pressure/discomfort. Patient appears euvolemic. and is on ACE inhibitors and beta-blocker and hydrocholorothiazide stable at this time. Defib interrogation The patient has had a pacemaker/defib interrogation which has shown that the pacemaker/defib battery has reached end of life. We have had further discussion of battery change out with all of the risks and benefits. Otherwise the patient has not had any significant side effects or symptoms of the pacemaker wires.  Past Medical and Surgical History  Past Medical History Past Medical History  Diagnosis Date  . Allergic rhinitis  . Carotid artery occlusion  . History of breast cancer  . History of cardiomyopathy  Echocardiogram 08/04/12, normal LV function, EF of 50%. Moderate MR and TR. Defibrillator, September, 2010.  Marland Kitchen History of hemorrhoids  . History of hepatitis A  . History of left bundle branch block (LBBB)  . Hypertension  . Macular hole of both eyes  h/o  . Osteopenia  . Other and unspecified hyperlipidemia   Past Surgical History She has a past surgical history that includes lens  eye  surgery (Bilateral, 2012); Macular Hole Repair (Bilateral); Defibrillator; Mastectomy partial / lumpectomy; Hysterectomy; Tonsillectomy; Left knee partial replacement; Cataract extraction; and Colonoscopy (05/31/2005, 10/19/2010).   Medications and Allergies  Current Medications  Current Outpatient Prescriptions  Medication Sig Dispense Refill  . aspirin 81 MG EC tablet Take 81 mg by mouth daily.  . blood glucose diagnostic test strip Use 2 (two) times daily. Use as instructed. 100 each 12  . CA CIT/MGOX/VIT D3/MANG/MIN AA (CALCIUM-VIT D3-MAG-MANGANESE ORAL) Take 1 tablet by mouth daily.  . calcium carbonate-vitamin D3 (OS-CAL 500+D) 500 mg(1,250mg ) -200 unit tablet Take 1 tablet by mouth once daily.  . carvedilol (COREG) 12.5 MG tablet Take 1 tablet (12.5 mg total) by mouth 2 (two) times daily with meals. 180 tablet 3  . cholecalciferol (VITAMIN D3) 1,000 unit capsule Take 1,000 Units by mouth once daily.  Marland Kitchen GLUC HCL/GLUC SU/AC-D-GLUCOS (GLUCOSAMINE COMPLEX ORAL) Take 1 tablet by mouth daily.  Marland Kitchen lancets (ONETOUCH DELICA LANCETS) 30 gauge Misc Use 1 Lancet 2 (two) times daily. 200 each 3  . lancets Use 1 each 3 (three) times daily. Use as instructed. 100 each 12  . lisinopril-hydrochlorothiazide (PRINZIDE,ZESTORETIC) 20-12.5 mg tablet TAKE 2 TABLETS ONCE A DAY 180 tablet 2  . metFORMIN (GLUCOPHAGE) 850 MG tablet Take 1 tablet (850 mg) two times a day. 180 tablet 1  . multivitamin capsule Take 1 capsule by mouth daily.   No current facility-administered medications for this visit.   Allergies: Bacitracin  Social and Family History  Social History reports that she has never smoked. She has never used smokeless tobacco. She reports that she drinks alcohol. She reports that she does not use illicit drugs.  Family History Family History  Problem Relation Age of Onset  . Hypertension Mother  . Breast cancer Mother  . Stroke Father  . Alzheimer's disease Father  . Lung cancer Father  .  Diabetes mellitus  grandparents  . Colon cancer  sibling  . Osteoporosis  grandmother  . Diabetes type II Maternal Grandmother  . Diabetes type II Maternal Grandfather  . Diabetes type II Paternal Grandmother  . Diabetes type II Paternal Grandfather   Review of Systems   Review of Systems  Positive for sob Negative for weight gain weight loss, weakness, vision change, hearing loss, cough, congestion, PND, orthopnea, nausea, diaphoresis, vomiting, diarrhea, bloody stool, melena, stomach pain, extremity pain, leg weakness, leg cramping, leg blood clots, headache, blackouts, nosebleed, trouble swallowing, mouth pain, urinary frequency, urination at night, muscle weakness, skin lesions, skin rashes, tingling ,ulcers, numbness, anxiety, and/or depression Physical Examination   Vitals: Visit Vitals  . BP 120/80 (BP Location: Right upper arm, Patient Position: Sitting, BP Cuff Size: Adult)  . Resp 15  . Ht 154.9 cm (5\' 1" )  . Wt 85.7 kg (189 lb)  . BMI 35.71 kg/m2   Ht:154.9 cm (5\' 1" ) Wt:85.7 kg (189 lb) [id number] surface area is 1.92 meters squared. Body mass index is 35.71 kg/(m^2). Appearance: well appearing in no acute distress HEENT: Pupils equally reactive to light and accomodation, no xanthalasma  Neck: Levy, masses, or lymphadenopathy  Lungs: normal respiratory effort; no crackles, no rhonchi, no wheezes Heart: Regular rate and rhythm. Normal S1 S2 No gallops, murmur, no rub, PMI is normal size and placement. carotid upstroke normal without bruit. Jugular venous pressure is normal Abdomen: soft, nontender, not distended with normal bowel sounds. No apparent hepatosplenomegally. Abdominal aorta is normal size without bruit Extremities: no edema, no ulcers, no clubbing, no cyanosis  Peripheral Pulses: 2+ in upper extremities, 2+ femoral pulses bilaterally, 2+lower extremity  Musculoskeletal; Normal muscle tone without kyphosis Neurological: Cranial nerves intact, Oriented and  Alert  Assessment   69 y.o. female with  Encounter Diagnoses  Name Primary?  . Chronic systolic CHF (congestive heart failure), NYHA class 2 Yes  . Moderate tricuspid insufficiency  . Benign essential hypertension   Plan  -the patient will have a consultation and or surgical treatment for pacemaker/defib battery end of life with battery change out. Patient understands all risks and benefits of battery change out. This includes the possibility of death, stroke, heart attack, infection, bleeding, blood clot, and or side effects of medication management for anesthesia and agrees to proceed -No further medical intervention of congestive heart failure which appears to be clinically stable at this time. The patient has been on appropriate medication management as well as using diet and exercise to improve quality of life. We have discussed moving forward that the patient would continue these measures. The patient understands to call if any new significant symptoms occurred to reduce hospitalization. -The patient will continue treatment of primary risk factors to reduce the progression of tricuspid regurgitation and its manifestations with medications listed above  No orders of the defined types were placed in this encounter.  Return in about 4 weeks (around 06/01/2015).  Teresa Dibble, MD    Plan of Care Upcoming Encounters Upcoming Encounters  Date Type Specialty Providers Description  10/20/2015 Appointment Internal Medicine Bronstein, Fuller Canada, MD  [phone]  [place], [place] [address]  [phone]  [phone] (Fax)    Visit Diagnoses   Chronic systolic CHF (congestive heart failure), NYHA class 2 - Primary   Moderate tricuspid insufficiency   Benign essential hypertension Essential hypertension, benign  Document Information Primary Care Provider Fuller Canada Bronstein (Nov. 06, 2013 - Present) [phone] (Work) [phone] (Fax)  [address], [place] [address] Document Coverage Dates Nov. 10, 2016 - Nov. 10, 2016 [place] [phone] (Work) [place], [place] [address] Encounter Providers Bruce Lenn Sink (Attending) [phone] (Work) [phone] (Fax)  [place] [facility] [place], [place] [address] Encounter Date Nov. 10, 2016 - Nov. 10, 2016 Printout Information Document Contents Office Visit Document Received Date 05/08/2015 Document Source Organization [facility] Patient Demographics  Reason for Visit  Encounter Details  Last Filed Vital Signs (In this Visit)  Progress Notes  Plan of Care  Visit Diagnoses  Document Information Encounter Summary - Teresa Levy [id number] y.o. Female)As of Nov. 10, 2016 Patient Demographics Patient Address Communication Language Race / Ethnicity  [address] [place], [place] [address]  [phone] [facility]) [phone] (Home) [email or web address]  English (Preferred) Caucasian/White / Not Hispanic/Latino  Reason for Visit Reason Comments  Follow-up   Pacer-ICD eri   Cardiomyopathy   Encounter Details Date Type Department Care Team Description  05/04/2015 Office Visit [facility]  [place], [place] [address]  [phone]  Teresa Levy, [place]  [facility]  [place], [place] [address]  [phone]  [phone] (Fax)  Chronic systolic CHF (congestive heart failure), NYHA class 2 (Primary Dx);Moderate tricuspid insufficiency;Benign essential hypertension  Last Filed Vital Signs (In this Visit) Vital Sign Reading Time Taken  Blood Pressure 120/80 05/04/2015 10:01 AM EST  Pulse - -  Temperature - -  Respiratory Rate 15 05/04/2015 10:01 AM EST  Oxygen Saturation - -  Weight 85.7 kg (189 lb) 05/04/2015  10:01 AM EST  Height 154.9 cm (5\' 1" ) 05/04/2015 10:01 AM EST  Body Mass Index 35.71  05/04/2015 10:01 AM EST  Progress Notes Teresa Dibble, MD - 05/04/2015 9:15 AM EST Formatting of this note may be different from the original. Established Patient Visit   Chief Complaint: Chief Complaint  Patient presents with  . Follow-up  . Pacer-ICD  eri  . Cardiomyopathy  Date of Service: 05/04/2015 Date of Birth: 12-11-1945 PCP: Teresa Brandy Hale, MD  History of Present Illness: Ms. Woodhall is a 69 y.o.female patient  Tricuspid valve insufficiency The patient has a diagnosis of moderate non rheumatic tricuspid valve insufficiency made by echocardiogram recently. This has been associated with symptoms including shortness of breath and has occurred during climbing stairs stable at this time.There has not been associated pulmonary hypertension and has not been associated extremity edema. Etiology of the tricuspid regurgitation includes congestive heart failure, pacer wire and atrial enlargement. Current treatment includes diuretics, b-blockers and antihypertensives  Congestive heart failure The patient has chronic mild systolic heart failure secondary to Non-ischemic. Congestive Heart Failure has been manifested by severe exercise intolerance with a [place] Functional Class of II. and exertional chest pressure/discomfort. Patient appears euvolemic. and is on ACE inhibitors and beta-blocker and hydrocholorothiazide stable at this time. Defib interrogation The patient has had a pacemaker/defib interrogation which has shown that the pacemaker/defib battery has reached end of life. We have had further discussion of battery change out with all of the risks and benefits. Otherwise the patient has not had any significant side effects or symptoms of the pacemaker wires.  Past Medical and Surgical History  Past Medical History Past Medical History  Diagnosis Date  . Allergic rhinitis  . Carotid artery occlusion  . History of breast cancer  . History of cardiomyopathy    Echocardiogram 08/04/12, normal LV function, EF of 50%. Moderate MR and TR. Defibrillator, September, 2010.  Marland Kitchen History of hemorrhoids  . History of hepatitis A  . History of left bundle branch block (LBBB)  . Hypertension  . Macular hole of both eyes  h/o  . Osteopenia  . Other and unspecified hyperlipidemia   Past Surgical History She has a past surgical history that includes lens eye surgery (Bilateral, 2012); Macular Hole Repair (Bilateral); Defibrillator; Mastectomy partial / lumpectomy; Hysterectomy; Tonsillectomy; Left knee partial replacement; Cataract extraction; and Colonoscopy (05/31/2005, 10/19/2010).   Medications and Allergies  Current Medications  Current Outpatient Prescriptions  Medication Sig Dispense Refill  . aspirin 81 MG EC tablet Take 81 mg by mouth daily.  . blood glucose diagnostic test strip Use 2 (two) times daily. Use as instructed. 100 each 12  . CA CIT/MGOX/VIT D3/MANG/MIN AA (CALCIUM-VIT D3-MAG-MANGANESE ORAL) Take 1 tablet by mouth daily.  . calcium carbonate-vitamin D3 (OS-CAL 500+D) 500 mg(1,250mg ) -200 unit tablet Take 1 tablet by mouth once daily.  . carvedilol (COREG) 12.5 MG tablet Take 1 tablet (12.5 mg total) by mouth 2 (two) times daily with meals. 180 tablet 3  . cholecalciferol (VITAMIN D3) 1,000 unit capsule Take 1,000 Units by mouth once daily.  Marland Kitchen GLUC HCL/GLUC SU/AC-D-GLUCOS (GLUCOSAMINE COMPLEX ORAL) Take 1 tablet by mouth daily.  Marland Kitchen lancets (ONETOUCH DELICA LANCETS) 30 gauge Misc Use 1 Lancet 2 (two) times daily. 200 each 3  . lancets Use 1 each 3 (three) times daily. Use as instructed. 100 each 12  . lisinopril-hydrochlorothiazide (PRINZIDE,ZESTORETIC) 20-12.5 mg tablet TAKE 2 TABLETS ONCE A DAY 180 tablet 2  . metFORMIN (GLUCOPHAGE) 850  MG tablet Take 1 tablet (850 mg) two times a day. 180 tablet 1  . multivitamin capsule Take 1 capsule by mouth daily.   No current facility-administered medications for this visit.   Allergies:  Bacitracin  Social and Family History  Social History reports that she has never smoked. She has never used smokeless tobacco. She reports that she drinks alcohol. She reports that she does not use illicit drugs.  Family History Family History  Problem Relation Age of Onset  . Hypertension Mother  . Breast cancer Mother  . Stroke Father  . Alzheimer's disease Father  . Lung cancer Father  . Diabetes mellitus  grandparents  . Colon cancer  sibling  . Osteoporosis  grandmother  . Diabetes type II Maternal Grandmother  . Diabetes type II Maternal Grandfather  . Diabetes type II Paternal Grandmother  . Diabetes type II Paternal Grandfather   Review of Systems   Review of Systems  Positive for sob Negative for weight gain weight loss, weakness, vision change, hearing loss, cough, congestion, PND, orthopnea, nausea, diaphoresis, vomiting, diarrhea, bloody stool, melena, stomach pain, extremity pain, leg weakness, leg cramping, leg blood clots, headache, blackouts, nosebleed, trouble swallowing, mouth pain, urinary frequency, urination at night, muscle weakness, skin lesions, skin rashes, tingling ,ulcers, numbness, anxiety, and/or depression Physical Examination   Vitals: Visit Vitals  . BP 120/80 (BP Location: Right upper arm, Patient Position: Sitting, BP Cuff Size: Adult)  . Resp 15  . Ht 154.9 cm (5\' 1" )  . Wt 85.7 kg (189 lb)  . BMI 35.71 kg/m2   Ht:154.9 cm (5\' 1" ) Wt:85.7 kg (189 lb) [id number] surface area is 1.92 meters squared. Body mass index is 35.71 kg/(m^2). Appearance: well appearing in no acute distress HEENT: Pupils equally reactive to light and accomodation, no xanthalasma  Neck: Levy, masses, or lymphadenopathy  Lungs: normal respiratory effort; no crackles, no rhonchi, no wheezes Heart: Regular rate and rhythm. Normal S1 S2 No gallops, murmur, no rub, PMI is normal size and placement. carotid upstroke normal without bruit. Jugular venous pressure is  normal Abdomen: soft, nontender, not distended with normal bowel sounds. No apparent hepatosplenomegally. Abdominal aorta is normal size without bruit Extremities: no edema, no ulcers, no clubbing, no cyanosis Peripheral Pulses: 2+ in upper extremities, 2+ femoral pulses bilaterally, 2+lower extremity  Musculoskeletal; Normal muscle tone without kyphosis Neurological: Cranial nerves intact, Oriented and Alert  Assessment   70 y.o. female with  Encounter Diagnoses  Name Primary?  . Chronic systolic CHF (congestive heart failure), NYHA class 2 Yes  . Moderate tricuspid insufficiency  . Benign essential hypertension   Plan  -the patient will have a consultation and or surgical treatment for pacemaker/defib battery end of life with battery change out. Patient understands all risks and benefits of battery change out. This includes the possibility of death, stroke, heart attack, infection, bleeding, blood clot, and or side effects of medication management for anesthesia and agrees to proceed -No further medical intervention of congestive heart failure which appears to be clinically stable at this time. The patient has been on appropriate medication management as well as using diet and exercise to improve quality of life. We have discussed moving forward that the patient would continue these measures. The patient understands to call if any new significant symptoms occurred to reduce hospitalization. -The patient will continue treatment of primary risk factors to reduce the progression of tricuspid regurgitation and its manifestations with medications listed above  No orders of the defined types were  placed in this encounter.  Return in about 4 weeks (around 06/01/2015).  Teresa Dibble, MD    Plan of Care Upcoming Encounters Upcoming Encounters  Date Type Specialty Providers Description  10/20/2015 Appointment Internal Medicine Bronstein, Fuller Canada, MD  [phone] [address]   [place], [place] [address]  [phone]  [phone] (Fax)    Visit Diagnoses   Chronic systolic CHF (congestive heart failure), NYHA class 2 - Primary   Moderate tricuspid insufficiency   Benign essential hypertension Essential hypertension, benign  Document Information Primary Care Provider Fuller Canada Bronstein (Nov. 06, 2013 - Present) [phone] (Work) [phone] (Fax)  [address], [place] [address] Document Coverage Dates Nov. 10, 2016 - Nov. 10, 2016 [place] [phone] (Work) [place], [place] [address] Encounter Providers Bruce Lenn Sink (Attending) [phone] (Work) [phone] (Fax)  [facility] [place], [place] [address] Encounter Date Nov. 10, 2016 - Nov. 10, 2016

## 2015-05-24 NOTE — Anesthesia Postprocedure Evaluation (Signed)
Anesthesia Post Note  Patient: Teresa Levy  Procedure(s) Performed: Procedure(s) (LRB): ICD GENERATOR CHANGE (Left)  Patient location during evaluation: PACU Level of consciousness: awake and alert Pain management: pain level controlled Vital Signs Assessment: post-procedure vital signs reviewed and stable Respiratory status: spontaneous breathing Anesthetic complications: no    Last Vitals:  Filed Vitals:   05/23/15 1425 05/23/15 1434  BP: 147/80 156/71  Pulse: 55 57  Temp:  36.7 C  Resp: 18 16    Last Pain:  Filed Vitals:   05/24/15 0827  PainSc: [place]

## 2015-06-07 DIAGNOSIS — I5022 Chronic systolic (congestive) heart failure: Secondary | ICD-10-CM | POA: Insufficient documentation

## 2016-04-23 ENCOUNTER — Other Ambulatory Visit: Payer: Self-pay | Admitting: Family Medicine

## 2016-04-23 DIAGNOSIS — Z1231 Encounter for screening mammogram for malignant neoplasm of breast: Secondary | ICD-10-CM

## 2016-05-24 ENCOUNTER — Encounter: Payer: Self-pay | Admitting: *Deleted

## 2016-05-27 ENCOUNTER — Ambulatory Visit
Admission: RE | Admit: 2016-05-27 | Discharge: 2016-05-27 | Disposition: A | Discharge status: Home / Self Care | Payer: Medicare Other | Source: Ambulatory Visit | Attending: Unknown Physician Specialty | Admitting: Unknown Physician Specialty

## 2016-05-27 ENCOUNTER — Ambulatory Visit: Payer: Medicare Other | Admitting: Certified Registered"

## 2016-05-27 ENCOUNTER — Encounter
Admission: RE | Disposition: A | Discharge status: Home / Self Care | Payer: Self-pay | Source: Ambulatory Visit | Attending: Unknown Physician Specialty

## 2016-05-27 DIAGNOSIS — I2510 Atherosclerotic heart disease of native coronary artery without angina pectoris: Secondary | ICD-10-CM | POA: Diagnosis not present

## 2016-05-27 DIAGNOSIS — M8580 Other specified disorders of bone density and structure, unspecified site: Secondary | ICD-10-CM | POA: Diagnosis not present

## 2016-05-27 DIAGNOSIS — I509 Heart failure, unspecified: Secondary | ICD-10-CM | POA: Insufficient documentation

## 2016-05-27 DIAGNOSIS — Z853 Personal history of malignant neoplasm of breast: Secondary | ICD-10-CM | POA: Diagnosis not present

## 2016-05-27 DIAGNOSIS — Z950 Presence of cardiac pacemaker: Secondary | ICD-10-CM | POA: Insufficient documentation

## 2016-05-27 DIAGNOSIS — Z7984 Long term (current) use of oral hypoglycemic drugs: Secondary | ICD-10-CM | POA: Insufficient documentation

## 2016-05-27 DIAGNOSIS — Z800 Family history of malignant neoplasm of digestive organs: Secondary | ICD-10-CM | POA: Diagnosis not present

## 2016-05-27 DIAGNOSIS — I447 Left bundle-branch block, unspecified: Secondary | ICD-10-CM | POA: Insufficient documentation

## 2016-05-27 DIAGNOSIS — D123 Benign neoplasm of transverse colon: Secondary | ICD-10-CM | POA: Diagnosis not present

## 2016-05-27 DIAGNOSIS — D125 Benign neoplasm of sigmoid colon: Secondary | ICD-10-CM | POA: Insufficient documentation

## 2016-05-27 DIAGNOSIS — Z7982 Long term (current) use of aspirin: Secondary | ICD-10-CM | POA: Diagnosis not present

## 2016-05-27 DIAGNOSIS — E785 Hyperlipidemia, unspecified: Secondary | ICD-10-CM | POA: Insufficient documentation

## 2016-05-27 DIAGNOSIS — Z79899 Other long term (current) drug therapy: Secondary | ICD-10-CM | POA: Insufficient documentation

## 2016-05-27 DIAGNOSIS — I110 Hypertensive heart disease with heart failure: Secondary | ICD-10-CM | POA: Insufficient documentation

## 2016-05-27 DIAGNOSIS — Z1211 Encounter for screening for malignant neoplasm of colon: Secondary | ICD-10-CM | POA: Insufficient documentation

## 2016-05-27 DIAGNOSIS — K640 First degree hemorrhoids: Secondary | ICD-10-CM | POA: Diagnosis not present

## 2016-05-27 HISTORY — DX: Heart failure, unspecified: I50.9

## 2016-05-27 HISTORY — PX: COLONOSCOPY WITH PROPOFOL: SHX5780

## 2016-05-27 HISTORY — DX: Cardiac murmur, unspecified: R01.1

## 2016-05-27 LAB — GLUCOSE, CAPILLARY: GLUCOSE-CAPILLARY: 152 mg/dL — AB (ref 65–99)

## 2016-05-27 SURGERY — COLONOSCOPY WITH PROPOFOL
Anesthesia: General

## 2016-05-27 MED ORDER — LIDOCAINE HCL (CARDIAC) 20 MG/ML IV SOLN
INTRAVENOUS | Status: DC | PRN
  Administered 2016-05-27: 80 mg via INTRAVENOUS

## 2016-05-27 MED ORDER — SODIUM CHLORIDE 0.9 % IV SOLN: INTRAVENOUS | Status: DC

## 2016-05-27 MED ORDER — MIDAZOLAM HCL 2 MG/2ML IJ SOLN
INTRAMUSCULAR | Status: DC | PRN
  Administered 2016-05-27: 2 mg via INTRAVENOUS

## 2016-05-27 MED ORDER — SODIUM CHLORIDE 0.9 % IV SOLN
INTRAVENOUS | Status: DC
  Administered 2016-05-27: 07:00:00 via INTRAVENOUS

## 2016-05-27 MED ORDER — EPHEDRINE SULFATE 50 MG/ML IJ SOLN
INTRAMUSCULAR | Status: DC | PRN
  Administered 2016-05-27 (×2): 5 mg via INTRAVENOUS

## 2016-05-27 MED ORDER — PIPERACILLIN-TAZOBACTAM 3.375 G IVPB 30 MIN
3.3750 g | Freq: Once | INTRAVENOUS | Status: AC
  Administered 2016-05-27: 3.375 g via INTRAVENOUS

## 2016-05-27 MED ORDER — PROPOFOL 500 MG/50ML IV EMUL
INTRAVENOUS | Status: DC | PRN
  Administered 2016-05-27: 125 ug/kg/min via INTRAVENOUS

## 2016-05-27 MED ORDER — PROPOFOL 10 MG/ML IV BOLUS
INTRAVENOUS | Status: DC | PRN
  Administered 2016-05-27: 40 mg via INTRAVENOUS
  Administered 2016-05-27: 20 mg via INTRAVENOUS

## 2016-05-27 NOTE — Anesthesia Procedure Notes (Signed)
Performed by: Brisa Auth Pre-anesthesia Checklist: Patient identified, Emergency Drugs available, Suction available, Patient being monitored and Timeout performed Patient Re-evaluated:Patient Re-evaluated prior to inductionOxygen Delivery Method: Nasal cannula Intubation Type: IV induction       

## 2016-05-27 NOTE — Transfer of Care (Signed)
Immediate Anesthesia Transfer of Care Note  Patient: Teresa Levy  Procedure(s) Performed: Procedure(s): COLONOSCOPY WITH PROPOFOL (N/A)  Patient Location: PACU  Anesthesia Type:General  Level of Consciousness: awake, alert  and responds to stimulation  Airway & Oxygen Therapy: Patient Spontanous Breathing and Patient connected to nasal cannula oxygen  Post-op Assessment: Report given to RN and Post -op Vital signs reviewed and stable  Post vital signs: Reviewed and stable  Last Vitals:  Vitals:   05/27/16 0700 05/27/16 0811  BP: 135/79 (!) 99/47  Pulse: 67 71  Resp: 18 14  Temp: 36.6 C     Last Pain:  Vitals:   05/27/16 0700  TempSrc: Tympanic         Complications: No apparent anesthesia complications

## 2016-05-27 NOTE — Op Note (Signed)
[facility] Patient Name: Teresa Levy Procedure Date: 05/27/2016 7:34 AM MRN: [id number] Account #: [id number] Date of Birth: 1946-06-24 Admit Type: Outpatient Age: 70 Room: [facility] ENDO ROOM 4 Gender: Female Note Status: Finalized Procedure:            Colonoscopy Indications:          Screening in patient at increased risk: Family history                        of 1st-degree relative with colorectal cancer Providers:            Manya Silvas, MD Referring MD:         Youlanda Roys. Lovie Macadamia, MD (Referring MD) Medicines:            Propofol per Anesthesia Complications:        No immediate complications. Procedure:            Pre-Anesthesia Assessment:                       - After reviewing the risks and benefits, the patient                        was deemed in satisfactory condition to undergo the                        procedure.                       After obtaining informed consent, the colonoscope was                        passed under direct vision. Throughout the procedure,                        the patient's blood pressure, pulse, and oxygen                        saturations were monitored continuously. The                        Colonoscope was introduced through the anus and                        advanced to the the cecum, identified by appendiceal                        orifice and ileocecal valve. The colonoscopy was                        performed without difficulty. The patient tolerated the                        procedure well. The quality of the bowel preparation                        was good. Findings:      Two sessile polyps were found in the transverse colon. The polyps were       diminutive in size. These polyps were removed with a jumbo cold forceps.       Resection and retrieval were complete.  A small polyp was found in the sigmoid colon. The polyp was sessile. The       polyp was removed with a hot  snare. A small part of it was removed with       cold forceps. Resection and retrieval were complete.      A diminutive polyp was found in the sigmoid colon. The polyp was       sessile. The polyp was removed with a jumbo cold forceps. Resection and       retrieval were complete.      Internal hemorrhoids were found during endoscopy. The hemorrhoids were       small and Grade I (internal hemorrhoids that do not prolapse). Impression:           - Two diminutive polyps in the transverse colon,                        removed with a jumbo cold forceps. Resected and                        retrieved.                       - One small polyp in the sigmoid colon, removed with a                        hot snare. Resected and retrieved.                       - One polyp.                       - One diminutive polyp in the sigmoid colon, removed                        with a jumbo cold forceps. Resected and retrieved.                       - Internal hemorrhoids. Recommendation:       - Resume regular diet indefinitely. Manya Silvas, MD 05/27/2016 8:09:28 AM This report has been signed electronically. Number of Addenda: 0 Note Initiated On: 05/27/2016 7:34 AM Scope Withdrawal Time: 0 hours 15 minutes 13 seconds  Total Procedure Duration: 0 hours 23 minutes 40 seconds       [facility]

## 2016-05-27 NOTE — H&P (Signed)
Primary Care Physician:  Juluis Pitch, MD Primary Gastroenterologist:  Dr. Vira Agar  Pre-Procedure History & Physical: HPI:  Teresa Levy is a 70 y.o. female is here for an colonoscopy.   Past Medical History:  Diagnosis Date  . Breast cancer ([place]) 2004   left breast with rad tx.   . CHF (congestive heart failure) ([place])   . Coronary artery disease   . Heart murmur   . Hepatitis 1981   Hepatitis A  . Hyperlipemia   . Hypertension   . Left bundle branch block    has defibrilator and pacemaker  . Osteopenia     Past Surgical History:  Procedure Laterality Date  . ABDOMINAL HYSTERECTOMY    . COLONOSCOPY    . EYE SURGERY Bilateral    cataract and retinal surgery  . IMPLANTABLE CARDIOVERTER DEFIBRILLATOR (ICD) GENERATOR CHANGE Left 05/23/2015   Procedure: ICD GENERATOR CHANGE;  Surgeon: Isaias Cowman, MD;  Location: [facility] ORS;  Service: Cardiovascular;  Laterality: Left;  . INSERTION OF ICD    . MASTECTOMY Left    with sentinal node  . MEDIAL PARTIAL KNEE REPLACEMENT Left     Prior to Admission medications   Medication Sig Start Date End Date Taking? Authorizing Provider  aspirin EC 81 MG tablet Take 81 mg by mouth daily.   Yes Historical Provider, MD  calcium-vitamin D (OSCAL WITH D) 500-200 MG-UNIT per tablet Take 1 tablet by mouth daily.   Yes Historical Provider, MD  carvedilol (COREG) 12.5 MG tablet Take 12.5 mg by mouth 2 (two) times daily with a meal.   Yes Historical Provider, MD  Cholecalciferol (VITAMIN D3) 1000 UNITS CAPS Take 1,000 Units by mouth daily.    Yes Historical Provider, MD  Glucosamine-Chondroit-Vit C-Mn (GLUCOSAMINE CHONDR 1500 COMPLX) CAPS Take 1 capsule by mouth daily.    Yes Historical Provider, MD  metFORMIN (GLUCOPHAGE) 850 MG tablet Take 850 mg by mouth 2 (two) times daily with a meal.  11/03/14  Yes Historical Provider, MD  Multiple Vitamin (MULTIVITAMIN) capsule Take 1 capsule by mouth daily.    Yes Historical Provider, MD   carvedilol (COREG) 12.5 MG tablet Take 1 tablet by mouth 2 (two) times daily. 08/16/14 08/16/15  Historical Provider, MD  lisinopril-hydrochlorothiazide (PRINZIDE,ZESTORETIC) 20-12.5 MG per tablet Take 2 tablets by mouth daily. 10/04/14   Historical Provider, MD    Allergies as of 04/18/2016 - Review Complete 05/23/2015  Allergen Reaction Noted  . Bacitracin Swelling 11/11/2014    Family History  Problem Relation Age of Onset  . Diabetes Father   . Diabetes Sister   . Diabetes Paternal Aunt   . Diabetes Paternal Uncle   . Diabetes Maternal Grandmother   . Breast cancer Mother 62    pt is unsure    Social History   Social History  . Marital status: Married    Spouse name: N/A  . Number of children: N/A  . Years of education: N/A   Occupational History  . Not on file.   Social History Main Topics  . Smoking status: Never Smoker  . Smokeless tobacco: Never Used  . Alcohol use 0.0 oz/week     Comment: months  . Drug use: No  . Sexual activity: No   Other Topics Concern  . Not on file   Social History Narrative  . No narrative on file    Review of Systems: See HPI, otherwise negative ROS  Physical Exam: BP 135/79   Pulse 67   Temp 97.8  F (36.6 C) (Tympanic)   Resp 18   Ht 5\' 1"  (1.549 m)   Wt 90.7 kg (200 lb)   SpO2 98%   BMI 37.79 kg/m  General:   Alert,  pleasant and cooperative in NAD Head:  Normocephalic and atraumatic. Neck:  Supple; no masses or thyromegaly. Lungs:  Clear throughout to auscultation.    Heart:  Regular rate and rhythm. Abdomen:  Soft, nontender and nondistended. Normal bowel sounds, without guarding, and without rebound.   Neurologic:  Alert and  oriented x4;  grossly normal neurologically.  Impression/Plan: [name] is here for an colonoscopy to be performed for screening due to [place] colon cancer in a sister  Risks, benefits, limitations, and alternatives regarding  colonoscopy have been reviewed with the patient.   Questions have been answered.  All parties agreeable.   Gaylyn Cheers, MD  05/27/2016, 7:30 AM

## 2016-05-27 NOTE — Anesthesia Preprocedure Evaluation (Signed)
Anesthesia Evaluation  Patient identified by MRN, date of birth, ID band Patient awake    Reviewed: Allergy & Precautions, H&P , NPO status , Patient's Chart, lab work & pertinent test results, reviewed documented beta blocker date and time   Airway Mallampati: II   Neck ROM: full    Dental  (+) Teeth Intact   Pulmonary neg pulmonary ROS,    Pulmonary exam normal        Cardiovascular Exercise Tolerance: Poor hypertension, + CAD and +CHF  (-) Orthopnea and (-) PND negative cardio ROS Normal cardiovascular exam+ dysrhythmias + Cardiac Defibrillator + Valvular Problems/Murmurs  Rhythm:regular Rate:Normal  Hx of LBBB and no previous MI No reported AICD firing. Can walk 2 blocks without difficulty   Neuro/Psych negative neurological ROS  negative psych ROS   GI/Hepatic negative GI ROS, Neg liver ROS,   Endo/Other  negative endocrine ROS  Renal/GU negative Renal ROS  negative genitourinary   Musculoskeletal   Abdominal   Peds  Hematology negative hematology ROS (+)   Anesthesia Other Findings Past Medical History: 2004: Breast cancer ([place])     Comment: left breast with rad tx.  No date: CHF (congestive heart failure) ([facility]) No date: Coronary artery disease No date: Heart murmur 1981: Hepatitis     Comment: Hepatitis A No date: Hyperlipemia No date: Hypertension No date: Left bundle branch block     Comment: has defibrilator and pacemaker No date: Osteopenia Past Surgical History: No date: ABDOMINAL HYSTERECTOMY No date: COLONOSCOPY No date: EYE SURGERY Bilateral     Comment: cataract and retinal surgery 05/23/2015: IMPLANTABLE CARDIOVERTER DEFIBRILLATOR (ICD) G* Left     Comment: Procedure: ICD GENERATOR CHANGE;  Surgeon:               Isaias Cowman, MD;  Location: [facility] ORS;                Service: Cardiovascular;  Laterality: Left; No date: INSERTION OF ICD No date: MASTECTOMY Left     Comment:  with sentinal node No date: MEDIAL PARTIAL KNEE REPLACEMENT Left BMI    Body Mass Index:  37.79 kg/m     Reproductive/Obstetrics negative OB ROS                             Anesthesia Physical Anesthesia Plan  ASA: III  Anesthesia Plan: General   Post-op Pain Management:    Induction:   Airway Management Planned:   Additional Equipment:   Intra-op Plan:   Post-operative Plan:   Informed Consent: I have reviewed the patients History and Physical, chart, labs and discussed the procedure including the risks, benefits and alternatives for the proposed anesthesia with the patient or authorized representative who has indicated his/her understanding and acceptance.   Dental Advisory Given  Plan Discussed with: CRNA  Anesthesia Plan Comments:         Anesthesia Quick Evaluation

## 2016-05-27 NOTE — Anesthesia Postprocedure Evaluation (Signed)
Anesthesia Post Note  Patient: Teresa Levy  Procedure(s) Performed: Procedure(s) (LRB): COLONOSCOPY WITH PROPOFOL (N/A)  Patient location during evaluation: PACU Anesthesia Type: General Level of consciousness: awake and alert Pain management: pain level controlled Vital Signs Assessment: post-procedure vital signs reviewed and stable Respiratory status: spontaneous breathing, nonlabored ventilation, respiratory function stable and patient connected to nasal cannula oxygen Cardiovascular status: blood pressure returned to baseline and stable Postop Assessment: no signs of nausea or vomiting Anesthetic complications: no    Last Vitals:  Vitals:   05/27/16 0830 05/27/16 0840  BP: 112/73 (!) 110/48  Pulse: 71 71  Resp: (!) 22 15  Temp:      Last Pain:  Vitals:   05/27/16 0810  TempSrc: Tympanic                 [name]

## 2016-05-28 ENCOUNTER — Encounter: Payer: Self-pay | Admitting: Unknown Physician Specialty

## 2016-05-28 LAB — SURGICAL PATHOLOGY

## 2016-05-29 ENCOUNTER — Ambulatory Visit
Admission: RE | Admit: 2016-05-29 | Discharge: 2016-05-29 | Disposition: A | Discharge status: Home / Self Care | Payer: Medicare Other | Source: Ambulatory Visit | Attending: Family Medicine | Admitting: Family Medicine

## 2016-05-29 DIAGNOSIS — Z1231 Encounter for screening mammogram for malignant neoplasm of breast: Secondary | ICD-10-CM | POA: Diagnosis not present

## 2017-01-03 DIAGNOSIS — E669 Obesity, unspecified: Secondary | ICD-10-CM | POA: Insufficient documentation

## 2017-01-15 DIAGNOSIS — Z853 Personal history of malignant neoplasm of breast: Secondary | ICD-10-CM | POA: Insufficient documentation

## 2017-01-27 ENCOUNTER — Encounter: Payer: Medicare Other | Attending: Family Medicine | Admitting: *Deleted

## 2017-01-27 ENCOUNTER — Encounter: Payer: Self-pay | Admitting: *Deleted

## 2017-01-27 VITALS — BP 118/72 | Ht 61.0 in | Wt 196.5 lb

## 2017-01-27 DIAGNOSIS — E119 Type 2 diabetes mellitus without complications: Secondary | ICD-10-CM | POA: Diagnosis present

## 2017-01-27 NOTE — Patient Instructions (Addendum)
Check blood sugars 1 x day before breakfast or 2 hrs after one meal every day Bring blood sugar records to the next MD appointment  Exercise: Begin walking for 10-15  minutes  3 days a week and gradually increase to 30 minutes 5 x week  Eat 3 meals day, 1-2 snacks a day Space meals 4-6 hours apart Don't skip meals  Make an eye doctor appointment  Carry fast acting glucose and a snack at all times (when starting Glimepiride)  Call back when you want to return for follow up appointment with nurse or dietitian

## 2017-01-27 NOTE — Progress Notes (Signed)
Diabetes Self-Management Education  Visit Type: First/Initial  Appt. Start Time: 0905 Appt. End Time: 1025  01/27/2017  Ms. Teresa Levy, identified by name and date of birth, is a 71 y.o. female with a diagnosis of Diabetes: Type 2.   ASSESSMENT  Blood pressure 118/72, height 5\' 1"  (1.549 m), weight 196 lb 8 oz (89.1 kg). Body mass index is 37.13 kg/m.      Diabetes Self-Management Education - 01/27/17 1100      Visit Information   Visit Type First/Initial     Initial Visit   Diabetes Type Type 2   Are you currently following a meal plan? No   Are you taking your medications as prescribed? Yes  She will start Glimepiride tomorrow.    Date Diagnosed 2016     Health Coping   How would you rate your overall health? Good     Psychosocial Assessment   Patient Belief/Attitude about Diabetes Other (comment)  "angry"   Self-care barriers None   Self-management support Doctor's office   Patient Concerns Nutrition/Meal planning;Medication;Monitoring;Healthy Lifestyle;Problem Solving;Glycemic Control;Weight Control   Special Needs None   Preferred Learning Style Auditory;Visual;Hands on   Learning Readiness Ready   How often do you need to have someone help you when you read instructions, pamphlets, or other written materials from your doctor or pharmacy? 1 - Never   What is the last grade level you completed in school? some college     Pre-Education Assessment   Patient understands the diabetes disease and treatment process. Needs Review   Patient understands incorporating nutritional management into lifestyle. Needs Review   Patient undertands incorporating physical activity into lifestyle. Needs Instruction   Patient understands using medications safely. Needs Instruction   Patient understands monitoring blood glucose, interpreting and using results Needs Review   Patient understands prevention, detection, and treatment of acute complications. Needs Instruction   Patient understands prevention, detection, and treatment of chronic complications. Needs Review   Patient understands how to develop strategies to address psychosocial issues. Needs Instruction   Patient understands how to develop strategies to promote health/change behavior. Needs Instruction     Complications   Last HgB A1C per patient/outside source 8.4 %  01/15/17   How often do you check your blood sugar? 0 times/day (not testing)  Pt brought her Accu-Chek Nano meter to appointment and required additional instruction. She hasn't checked her blood sugar in 1 year. Instructed on use and BG upon return demonstration was 163 mg/dL at 9:15 am - 1 hr pp.    Have you had a dilated eye exam in the past 12 months? Yes   Have you had a dental exam in the past 12 months? Yes   Are you checking your feet? Yes   How many days per week are you checking your feet? 7     Dietary Intake   Breakfast bagel wtih cream cheese; sausage biscuit; raisin toast with peanut butter; yogurt with fruit   Lunch sandwich with luncheon meat or cheese; salad   Dinner chicken, beef, shrimp, salmon, chili, spaghetti, meatloaf - peas, broccoli, brussel sprouts   Beverage(s) water, unsweetened tea     Exercise   Exercise Type ADL's     Patient Education   Previous Diabetes Education Yes (please comment)  She came to diabetes classes in 2016   Disease state  Explored patient's options for treatment of their diabetes   Nutrition management  Role of diet in the treatment of diabetes and the relationship between  the three main macronutrients and blood glucose level;Carbohydrate counting;Reviewed blood glucose goals for pre and post meals and how to evaluate the patients' food intake on their blood glucose level.;Meal timing in regards to the patients' current diabetes medication.   Physical activity and exercise  Role of exercise on diabetes management, blood pressure control and cardiac health.   Medications Reviewed  patients medication for diabetes, action, purpose, timing of dose and side effects.   Monitoring Taught/evaluated SMBG meter.;Purpose and frequency of SMBG.;Taught/discussed recording of test results and interpretation of SMBG.;Identified appropriate SMBG and/or A1C goals.;Yearly dilated eye exam   Acute complications Taught treatment of hypoglycemia - the 15 rule.   Chronic complications Relationship between chronic complications and blood glucose control   Psychosocial adjustment Identified and addressed patients feelings and concerns about diabetes     Individualized Goals (developed by patient)   Reducing Risk Improve blood sugars Decrease medications Prevent diabetes complications Lose weight Lead a healthier lifestyle Become more fit     Outcomes   Expected Outcomes Demonstrated interest in learning. Expect positive outcomes   Program Status Not Completed      Individualized Plan for Diabetes Self-Management Training:   Learning Objective:  Patient will have a greater understanding of diabetes self-management. Patient education plan is to attend individual and/or group sessions per assessed needs and concerns.   Plan:   Patient Instructions  Check blood sugars 1 x day before breakfast or 2 hrs after one meal every day Bring blood sugar records to the next MD appointment Exercise: Begin walking for 10-15  minutes  3 days a week and gradually increase to 30 minutes 5 x week Eat 3 meals day, 1-2 snacks a day Space meals 4-6 hours apart Don't skip meals Make an eye doctor appointment Carry fast acting glucose and a snack at all times (when starting Glimepiride) Call back when you want to return for follow up appointment with nurse or dietitian   Expected Outcomes:  Demonstrated interest in learning. Expect positive outcomes  Education material provided: General Meal Planning Guidelines Simple Meal Plan Symptoms, causes and treatments of Hypoglycemia  If problems or  questions, patient to contact team via:  Johny Drilling, Narcissa, CCM, CDE [phone]  Future DSME appointment:  Patient wants to work on changes and call back if she wants to schedule a follow up with the nurse of dietitian.

## 2017-04-21 ENCOUNTER — Other Ambulatory Visit: Payer: Self-pay | Admitting: Family Medicine

## 2017-04-21 DIAGNOSIS — Z1231 Encounter for screening mammogram for malignant neoplasm of breast: Secondary | ICD-10-CM

## 2017-05-22 ENCOUNTER — Telehealth: Payer: Self-pay | Admitting: *Deleted

## 2017-05-22 NOTE — Telephone Encounter (Signed)
Phone call to patient to follow up with goals set for Diabetes in August. She didn't want to return until she could work on changes. Unable to leave voice mail at this time because mailbox was full.

## 2017-07-22 ENCOUNTER — Ambulatory Visit
Admission: RE | Admit: 2017-07-22 | Discharge: 2017-07-22 | Disposition: A | Discharge status: Home / Self Care | Payer: Medicare Other | Source: Ambulatory Visit | Attending: Family Medicine | Admitting: Family Medicine

## 2017-07-22 DIAGNOSIS — Z1231 Encounter for screening mammogram for malignant neoplasm of breast: Secondary | ICD-10-CM | POA: Insufficient documentation

## 2017-09-23 DIAGNOSIS — M1711 Unilateral primary osteoarthritis, right knee: Secondary | ICD-10-CM | POA: Insufficient documentation

## 2017-12-29 ENCOUNTER — Emergency Department
Admission: EM | Admit: 2017-12-29 | Discharge: 2017-12-30 | Disposition: A | Discharge status: Home / Self Care | Payer: Medicare Other | Attending: Emergency Medicine | Admitting: Emergency Medicine

## 2017-12-29 ENCOUNTER — Other Ambulatory Visit: Payer: Self-pay

## 2017-12-29 ENCOUNTER — Encounter: Payer: Self-pay | Admitting: Emergency Medicine

## 2017-12-29 ENCOUNTER — Emergency Department: Payer: Medicare Other

## 2017-12-29 DIAGNOSIS — Z7982 Long term (current) use of aspirin: Secondary | ICD-10-CM | POA: Diagnosis not present

## 2017-12-29 DIAGNOSIS — I2510 Atherosclerotic heart disease of native coronary artery without angina pectoris: Secondary | ICD-10-CM | POA: Insufficient documentation

## 2017-12-29 DIAGNOSIS — R079 Chest pain, unspecified: Secondary | ICD-10-CM | POA: Insufficient documentation

## 2017-12-29 DIAGNOSIS — Z79899 Other long term (current) drug therapy: Secondary | ICD-10-CM | POA: Insufficient documentation

## 2017-12-29 DIAGNOSIS — I110 Hypertensive heart disease with heart failure: Secondary | ICD-10-CM | POA: Insufficient documentation

## 2017-12-29 DIAGNOSIS — Z7984 Long term (current) use of oral hypoglycemic drugs: Secondary | ICD-10-CM | POA: Diagnosis not present

## 2017-12-29 DIAGNOSIS — E119 Type 2 diabetes mellitus without complications: Secondary | ICD-10-CM | POA: Insufficient documentation

## 2017-12-29 DIAGNOSIS — I509 Heart failure, unspecified: Secondary | ICD-10-CM | POA: Diagnosis not present

## 2017-12-29 LAB — CBC
HEMATOCRIT: 36.4 % (ref 35.0–47.0)
HEMOGLOBIN: 12.3 g/dL (ref 12.0–16.0)
MCH: 29.4 pg (ref 26.0–34.0)
MCHC: 33.8 g/dL (ref 32.0–36.0)
MCV: 86.8 fL (ref 80.0–100.0)
Platelets: 187 10*3/uL (ref 150–440)
RBC: 4.2 MIL/uL (ref 3.80–5.20)
RDW: 12.9 % (ref 11.5–14.5)
WBC: 6.1 10*3/uL (ref 3.6–11.0)

## 2017-12-29 LAB — BASIC METABOLIC PANEL
Anion gap: 7 (ref 5–15)
BUN: 15 mg/dL (ref 8–23)
CHLORIDE: 105 mmol/L (ref 98–111)
CO2: 28 mmol/L (ref 22–32)
Calcium: 9.2 mg/dL (ref 8.9–10.3)
Creatinine, Ser: 0.69 mg/dL (ref 0.44–1.00)
GFR calc Af Amer: >60 mL/min (ref >60)
GFR calc non Af Amer: >60 mL/min (ref >60)
GLUCOSE: 203 mg/dL — AB (ref 70–99)
Potassium: 3.4 mmol/L — ABNORMAL LOW (ref 3.5–5.1)
SODIUM: 140 mmol/L (ref 135–145)

## 2017-12-29 LAB — TROPONIN I

## 2017-12-29 NOTE — ED Notes (Signed)
Appear in NAD, denies shob.

## 2017-12-29 NOTE — ED Triage Notes (Signed)
Pt states upper right back pain that started last pm, has been painting so she didn't think anything about it, midsternal chest tightness began about an hour ago, took a baby asa at home, has defibrillator and pacemaker.

## 2017-12-30 LAB — TROPONIN I

## 2017-12-30 NOTE — ED Provider Notes (Signed)
[facility] Emergency Department Provider Note   ____________________________________________   First MD Initiated Contact with Patient 12/29/17 2350     (approximate)  I have reviewed the triage vital signs and the nursing notes.   HISTORY  Chief Complaint Chest Pain    HPI Teresa Levy is a 72 y.o. female who comes into the hospital today with some chest tightness.  The patient has a history of coronary artery disease.  She states that the symptoms were off and on last night but she was sitting at the computer today when the tightness started in her chest.  The patient is having some mid chest pain and some pain under her right shoulder blade.  Currently she states that her pain is a 0 out of 10 has some back discomfort.  She reports that she does not have any shortness of breath but feels like she has to take a deep breath.  She denies any sweats, dizziness, lightheadedness, nausea, vomiting.  The patient came into the hospital today for evaluation of her symptoms.   Past Medical History:  Diagnosis Date  . Breast cancer ([place]) 2004   left breast with rad tx.   . CHF (congestive heart failure) ([place])   . Coronary artery disease   . Diabetes mellitus without complication ([place])   . Heart murmur   . Hepatitis 1981   Hepatitis A  . Hyperlipemia   . Hypertension   . Left bundle branch block    has defibrilator and pacemaker  . Osteopenia     Patient Active Problem List   Diagnosis Date Noted  . Essential (primary) hypertension 11/24/2014  . History of prolonged Q-T interval on ECG 11/24/2014  . Macular hole 11/24/2014  . TI (tricuspid incompetence) 08/16/2014  . Breathlessness on exertion 08/16/2014  . Bradycardia 08/06/2014  . Combined fat and carbohydrate induced hyperlipemia 02/08/2014    Past Surgical History:  Procedure Laterality Date  . ABDOMINAL HYSTERECTOMY    . 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 BREAST  LUMPECTOMY Left 2004   lumpectomy f/u with radiation, sentinal node biopsy   . COLONOSCOPY    . COLONOSCOPY WITH PROPOFOL N/A 05/27/2016   Procedure: COLONOSCOPY WITH PROPOFOL;  Surgeon: Manya Silvas, MD;  Location: [facility] ENDOSCOPY;  Service: Endoscopy;  Laterality: N/A;  . EYE SURGERY Bilateral    cataract and retinal surgery  . IMPLANTABLE CARDIOVERTER DEFIBRILLATOR (ICD) GENERATOR CHANGE Left 05/23/2015   Procedure: ICD GENERATOR CHANGE;  Surgeon: Isaias Cowman, MD;  Location: [facility] ORS;  Service: Cardiovascular;  Laterality: Left;  . INSERTION OF ICD    . MEDIAL PARTIAL KNEE REPLACEMENT Left     Prior to Admission medications   Medication Sig Start Date End Date Taking? Authorizing Provider  aspirin EC 81 MG tablet Take 81 mg by mouth daily.    [provider]  atorvastatin (LIPITOR) 20 MG tablet Take 20 mg by mouth daily. 12/09/16 12/09/17  [provider]  calcium-vitamin D (OSCAL WITH D) 500-200 MG-UNIT per tablet Take 1 tablet by mouth daily.    [provider]  carvedilol (COREG) 12.5 MG tablet Take 1 tablet by mouth 2 (two) times daily. 08/16/14 08/16/15  [provider]  Cholecalciferol (VITAMIN D3) 1000 UNITS CAPS Take 1,000 Units by mouth daily.     [provider]  escitalopram (LEXAPRO) 10 MG tablet Take 10 mg by mouth daily. 12/26/16   [provider]  glimepiride (AMARYL) 2 MG tablet  Take 2 mg by mouth daily with breakfast. 01/22/17 01/22/18  [provider]  Glucosamine-Chondroit-Vit C-Mn (GLUCOSAMINE CHONDR 1500 COMPLX) CAPS Take 1 capsule by mouth daily.     [provider]  lisinopril-hydrochlorothiazide (PRINZIDE,ZESTORETIC) 20-12.5 MG per tablet Take 2 tablets by mouth daily. 10/04/14   [provider]  metFORMIN (GLUCOPHAGE-XR) 750 MG 24 hr tablet Take 2 tablets by mouth daily with supper. 09/30/16   [provider]  Multiple Vitamin (MULTIVITAMIN) capsule Take 1 capsule by mouth  daily.     [provider]    Allergies Bacitracin and Tape  Family History  Problem Relation Age of Onset  . Diabetes Father   . Diabetes Sister   . Diabetes Paternal Aunt   . Diabetes Paternal Uncle   . Diabetes Maternal Grandmother   . Breast cancer Mother 35       pt is unsure    Social History Social History   Tobacco Use  . Smoking status: Never Smoker  . Smokeless tobacco: Never Used  Substance Use Topics  . Alcohol use: Yes    Alcohol/week: 0.0 oz    Comment: 1 per month - mixed drink  . Drug use: No    Review of Systems  Constitutional: No fever/chills Eyes: No visual changes. ENT: No sore throat. Cardiovascular:  chest pain. Respiratory: Denies shortness of breath. Gastrointestinal: No abdominal pain.  No nausea, no vomiting.   Genitourinary: Negative for dysuria. Musculoskeletal: Negative for back pain. Skin: Negative for rash. Neurological: Negative for headaches, focal weakness or numbness.   ____________________________________________   PHYSICAL EXAM:  VITAL SIGNS: ED Triage Vitals  Enc Vitals Group     BP 12/29/17 2051 (!) 160/86     Pulse Rate 12/29/17 2051 72     Resp 12/29/17 2051 19     Temp 12/29/17 2051 98.3 F (36.8 C)     Temp Source 12/29/17 2051 Oral     SpO2 12/29/17 2051 95 %     Weight 12/29/17 2051 200 lb (90.7 kg)     Height 12/29/17 2051 5\' 1"  (1.549 m)     Head Circumference --      Peak Flow --      Pain Score 12/29/17 2317 0     Pain Loc --      Pain Edu? --      Excl. in [place]? --     Constitutional: Alert and oriented. Well appearing and in mild distress. Eyes: Conjunctivae are normal. PERRL. EOMI. Head: Atraumatic. Nose: No congestion/rhinnorhea. Mouth/Throat: Mucous membranes are moist.  Oropharynx non-erythematous. Cardiovascular: Normal rate, regular rhythm.  Systolic murmur.  Good peripheral circulation. Respiratory: Normal respiratory effort.  No retractions. Lungs CTAB. Gastrointestinal: Soft  and nontender. No distention.  Positive bowel sounds Musculoskeletal: No lower extremity tenderness nor edema.   Neurologic:  Normal speech and language.  Skin:  Skin is warm, dry and intact.  Psychiatric: Mood and affect are normal.   ____________________________________________   LABS (all labs ordered are listed, but only abnormal results are displayed)  Labs Reviewed  BASIC METABOLIC PANEL - Abnormal; Notable for the following components:      Result Value   Potassium 3.4 (*)    Glucose, Bld 203 (*)    All other components within normal limits  CBC  TROPONIN I  TROPONIN I   ____________________________________________  EKG  ED ECG REPORT I, Loney Hering, the attending physician, personally viewed and interpreted this ECG.   Date: 12/29/2017  EKG Time: 2047  Rate: 72  Rhythm: Atrial sensed and ventricular paced rhythm  Axis: Normal  Intervals:none  ST&T Change: none  ____________________________________________  RADIOLOGY  ED MD interpretation: Chest x-ray: No active cardiopulmonary disease  Official radiology report(s): Dg Chest 2 View  Result Date: 12/29/2017 CLINICAL DATA:  Upper back pain since last evening. EXAM: CHEST - 2 VIEW COMPARISON:  05/08/2015 FINDINGS: ICD device projects over the left hemithorax with leads in the right atrium, right ventricle and coronary sinus similar to prior. Lungs are clear. No pneumothorax or effusion. No acute osseous abnormality. IMPRESSION: No active cardiopulmonary disease. Electronically Signed   By: Ashley Royalty M.D.   On: 12/29/2017 21:34    ____________________________________________   PROCEDURES  Procedure(s) performed: None  Procedures  Critical Care performed: No  ____________________________________________   INITIAL IMPRESSION / ASSESSMENT AND PLAN / ED COURSE  As part of my medical decision making, I reviewed the following data within the electronic MEDICAL RECORD NUMBER Notes from prior ED visits  and [place] Controlled Substance Database   This is a 72 year old female who comes into the hospital today with some chest tightness and some pain behind her shoulder blade.  The patient states that her pain is gone right now but she wanted it evaluated.  My differential diagnosis includes musculoskeletal pain, ACS, gastritis.  We did check some blood work on the patient and the patient's blood work is unremarkable.  The patient had a CBC, BMP and 2 troponins which were all negative.  The patient also had a chest x-ray which did not show any acute cause of her pain.  As her pain is 0 I did not give the patient any other medications.  The patient will be discharged home and encouraged to follow-up with her primary care physician.  She has no further questions or concerns at this time.      ____________________________________________   FINAL CLINICAL IMPRESSION(S) / ED DIAGNOSES  Final diagnoses:  Chest pain, unspecified type     ED Discharge Orders    None       Note:  This document was prepared using Dragon voice recognition software and may include unintentional dictation errors.    Loney Hering, MD 12/30/17 [phone]

## 2017-12-30 NOTE — Discharge Instructions (Addendum)
Your work-up this evening is unremarkable.  Please follow-up with your primary care physician for further evaluation of your chest pain.  Please return with any worsening conditions or any other concerns.

## 2017-12-30 NOTE — ED Notes (Signed)
Pt discharged to home.  Family member driving.  Discharge instructions reviewed.  Verbalized understanding.  No questions or concerns at this time.  Teach back verified.  Pt in NAD.  No items left in ED.   

## 2018-06-23 ENCOUNTER — Other Ambulatory Visit: Payer: Self-pay | Admitting: Family Medicine

## 2018-06-23 DIAGNOSIS — Z1231 Encounter for screening mammogram for malignant neoplasm of breast: Secondary | ICD-10-CM

## 2018-07-22 DIAGNOSIS — F419 Anxiety disorder, unspecified: Secondary | ICD-10-CM | POA: Insufficient documentation

## 2018-07-23 ENCOUNTER — Ambulatory Visit
Admission: RE | Admit: 2018-07-23 | Discharge: 2018-07-23 | Disposition: A | Discharge status: Home / Self Care | Payer: Medicare Other | Source: Ambulatory Visit | Attending: Family Medicine | Admitting: Family Medicine

## 2018-07-23 DIAGNOSIS — Z1231 Encounter for screening mammogram for malignant neoplasm of breast: Secondary | ICD-10-CM | POA: Diagnosis not present

## 2018-07-23 HISTORY — DX: Personal history of irradiation: Z92.3

## 2019-03-26 DIAGNOSIS — M8580 Other specified disorders of bone density and structure, unspecified site: Secondary | ICD-10-CM | POA: Insufficient documentation

## 2019-07-09 ENCOUNTER — Other Ambulatory Visit: Payer: Self-pay | Admitting: Family Medicine

## 2019-07-09 DIAGNOSIS — Z1231 Encounter for screening mammogram for malignant neoplasm of breast: Secondary | ICD-10-CM

## 2019-07-20 DIAGNOSIS — Z8616 Personal history of COVID-19: Secondary | ICD-10-CM

## 2019-07-20 HISTORY — DX: Personal history of COVID-19: Z86.16

## 2019-09-09 ENCOUNTER — Ambulatory Visit
Admission: RE | Admit: 2019-09-09 | Discharge: 2019-09-09 | Disposition: A | Discharge status: Home / Self Care | Payer: Medicare PPO | Source: Ambulatory Visit | Attending: Family Medicine | Admitting: Family Medicine

## 2019-09-09 DIAGNOSIS — Z1231 Encounter for screening mammogram for malignant neoplasm of breast: Secondary | ICD-10-CM | POA: Diagnosis not present

## 2019-09-15 ENCOUNTER — Other Ambulatory Visit
Admission: RE | Admit: 2019-09-15 | Discharge: 2019-09-15 | Disposition: A | Discharge status: Home / Self Care | Payer: Medicare PPO | Source: Ambulatory Visit | Attending: General Surgery | Admitting: General Surgery

## 2019-09-15 ENCOUNTER — Other Ambulatory Visit: Payer: Self-pay

## 2019-09-15 DIAGNOSIS — Z853 Personal history of malignant neoplasm of breast: Secondary | ICD-10-CM | POA: Diagnosis not present

## 2019-09-15 DIAGNOSIS — Z7982 Long term (current) use of aspirin: Secondary | ICD-10-CM | POA: Diagnosis not present

## 2019-09-15 DIAGNOSIS — Z20822 Contact with and (suspected) exposure to covid-19: Secondary | ICD-10-CM | POA: Diagnosis not present

## 2019-09-15 DIAGNOSIS — I2510 Atherosclerotic heart disease of native coronary artery without angina pectoris: Secondary | ICD-10-CM | POA: Diagnosis not present

## 2019-09-15 DIAGNOSIS — I509 Heart failure, unspecified: Secondary | ICD-10-CM | POA: Diagnosis not present

## 2019-09-15 DIAGNOSIS — Z1211 Encounter for screening for malignant neoplasm of colon: Secondary | ICD-10-CM | POA: Diagnosis not present

## 2019-09-15 DIAGNOSIS — E785 Hyperlipidemia, unspecified: Secondary | ICD-10-CM | POA: Diagnosis not present

## 2019-09-15 DIAGNOSIS — Z79899 Other long term (current) drug therapy: Secondary | ICD-10-CM | POA: Diagnosis not present

## 2019-09-15 DIAGNOSIS — I110 Hypertensive heart disease with heart failure: Secondary | ICD-10-CM | POA: Diagnosis not present

## 2019-09-15 DIAGNOSIS — M8580 Other specified disorders of bone density and structure, unspecified site: Secondary | ICD-10-CM | POA: Diagnosis not present

## 2019-09-15 DIAGNOSIS — Z95810 Presence of automatic (implantable) cardiac defibrillator: Secondary | ICD-10-CM | POA: Diagnosis not present

## 2019-09-15 DIAGNOSIS — Z86010 Personal history of colonic polyps: Secondary | ICD-10-CM | POA: Diagnosis not present

## 2019-09-15 DIAGNOSIS — Z7984 Long term (current) use of oral hypoglycemic drugs: Secondary | ICD-10-CM | POA: Diagnosis not present

## 2019-09-15 LAB — SARS CORONAVIRUS 2 (TAT 6-24 HRS): SARS Coronavirus 2: NEGATIVE

## 2019-09-16 ENCOUNTER — Encounter: Payer: Self-pay | Admitting: General Surgery

## 2019-09-17 ENCOUNTER — Encounter: Payer: Self-pay | Admitting: General Surgery

## 2019-09-17 ENCOUNTER — Ambulatory Visit: Payer: Medicare PPO | Admitting: Anesthesiology

## 2019-09-17 ENCOUNTER — Ambulatory Visit
Admission: RE | Admit: 2019-09-17 | Discharge: 2019-09-17 | Disposition: A | Discharge status: Home / Self Care | Payer: Medicare PPO | Attending: General Surgery | Admitting: General Surgery

## 2019-09-17 ENCOUNTER — Encounter
Admission: RE | Disposition: A | Discharge status: Home / Self Care | Payer: Self-pay | Source: Home / Self Care | Attending: General Surgery

## 2019-09-17 ENCOUNTER — Other Ambulatory Visit: Payer: Self-pay

## 2019-09-17 DIAGNOSIS — Z1211 Encounter for screening for malignant neoplasm of colon: Secondary | ICD-10-CM | POA: Diagnosis not present

## 2019-09-17 DIAGNOSIS — Z7982 Long term (current) use of aspirin: Secondary | ICD-10-CM | POA: Insufficient documentation

## 2019-09-17 DIAGNOSIS — E785 Hyperlipidemia, unspecified: Secondary | ICD-10-CM | POA: Insufficient documentation

## 2019-09-17 DIAGNOSIS — I2510 Atherosclerotic heart disease of native coronary artery without angina pectoris: Secondary | ICD-10-CM | POA: Insufficient documentation

## 2019-09-17 DIAGNOSIS — Z853 Personal history of malignant neoplasm of breast: Secondary | ICD-10-CM | POA: Insufficient documentation

## 2019-09-17 DIAGNOSIS — M8580 Other specified disorders of bone density and structure, unspecified site: Secondary | ICD-10-CM | POA: Insufficient documentation

## 2019-09-17 DIAGNOSIS — I110 Hypertensive heart disease with heart failure: Secondary | ICD-10-CM | POA: Insufficient documentation

## 2019-09-17 DIAGNOSIS — Z20822 Contact with and (suspected) exposure to covid-19: Secondary | ICD-10-CM | POA: Insufficient documentation

## 2019-09-17 DIAGNOSIS — Z86010 Personal history of colonic polyps: Secondary | ICD-10-CM | POA: Insufficient documentation

## 2019-09-17 DIAGNOSIS — I509 Heart failure, unspecified: Secondary | ICD-10-CM | POA: Insufficient documentation

## 2019-09-17 DIAGNOSIS — Z95810 Presence of automatic (implantable) cardiac defibrillator: Secondary | ICD-10-CM | POA: Insufficient documentation

## 2019-09-17 DIAGNOSIS — Z7984 Long term (current) use of oral hypoglycemic drugs: Secondary | ICD-10-CM | POA: Insufficient documentation

## 2019-09-17 DIAGNOSIS — Z79899 Other long term (current) drug therapy: Secondary | ICD-10-CM | POA: Insufficient documentation

## 2019-09-17 HISTORY — PX: COLONOSCOPY WITH PROPOFOL: SHX5780

## 2019-09-17 HISTORY — DX: Presence of automatic (implantable) cardiac defibrillator: Z95.810

## 2019-09-17 HISTORY — DX: Cardiomyopathy, unspecified: I42.9

## 2019-09-17 LAB — GLUCOSE, CAPILLARY: Glucose-Capillary: 124 mg/dL — ABNORMAL HIGH (ref 70–99)

## 2019-09-17 SURGERY — COLONOSCOPY WITH PROPOFOL
Anesthesia: General

## 2019-09-17 MED ORDER — PROPOFOL 500 MG/50ML IV EMUL
INTRAVENOUS | Status: AC
  Filled 2019-09-17: qty 50

## 2019-09-17 MED ORDER — PROPOFOL 10 MG/ML IV BOLUS
INTRAVENOUS | Status: DC | PRN
  Administered 2019-09-17: 20 mg via INTRAVENOUS
  Administered 2019-09-17: 10 mg via INTRAVENOUS
  Administered 2019-09-17: 50 mg via INTRAVENOUS
  Administered 2019-09-17: 30 mg via INTRAVENOUS

## 2019-09-17 MED ORDER — SODIUM CHLORIDE 0.9 % IV SOLN: INTRAVENOUS | Status: DC

## 2019-09-17 MED ORDER — PROPOFOL 500 MG/50ML IV EMUL
INTRAVENOUS | Status: DC | PRN
  Administered 2019-09-17: 125 ug/kg/min via INTRAVENOUS

## 2019-09-17 MED ORDER — PHENYLEPHRINE HCL (PRESSORS) 10 MG/ML IV SOLN
INTRAVENOUS | Status: DC | PRN
  Administered 2019-09-17: 100 ug via INTRAVENOUS

## 2019-09-17 NOTE — Transfer of Care (Signed)
Immediate Anesthesia Transfer of Care Note  Patient: Teresa Levy  Procedure(s) Performed: COLONOSCOPY WITH PROPOFOL (N/A )  Patient Location: PACU  Anesthesia Type:General  Level of Consciousness: sedated  Airway & Oxygen Therapy: Patient Spontanous Breathing and Patient connected to nasal cannula oxygen  Post-op Assessment: Report given to RN and Post -op Vital signs reviewed and stable  Post vital signs: Reviewed and stable  Last Vitals:  Vitals Value Taken Time  BP 127/62 09/17/19 0839  Temp    Pulse 70 09/17/19 0840  Resp 13 09/17/19 0840  SpO2 98 % 09/17/19 0840  Vitals shown include unvalidated device data.  Last Pain:  Vitals:   09/17/19 0735  TempSrc: Tympanic  PainSc: 0-No pain         Complications: No apparent anesthesia complications

## 2019-09-17 NOTE — Anesthesia Postprocedure Evaluation (Signed)
Anesthesia Post Note  Patient: Teresa Levy  Procedure(s) Performed: COLONOSCOPY WITH PROPOFOL (N/A )  Patient location during evaluation: PACU Anesthesia Type: General Level of consciousness: awake and alert Pain management: pain level controlled Vital Signs Assessment: post-procedure vital signs reviewed and stable Respiratory status: spontaneous breathing, nonlabored ventilation, respiratory function stable and patient connected to nasal cannula oxygen Cardiovascular status: blood pressure returned to baseline and stable Postop Assessment: no apparent nausea or vomiting Anesthetic complications: no     Last Vitals:  Vitals:   09/17/19 0839 09/17/19 0849  BP: 127/62 137/63  Pulse: 70 68  Resp: 14 17  Temp: (!) 36.4 C   SpO2: 100% 97%    Last Pain:  Vitals:   09/17/19 0849  TempSrc:   PainSc: 0-No pain                 [name]

## 2019-09-17 NOTE — H&P (Signed)
Teresa Levy [id number] [birth year]     HPI:  74 y/o woman who was identified with a sessile serrated adenoma in 2017.  For follow up exam. Tolerated prep well.   Medications Prior to Admission  Medication Sig Dispense Refill Last Dose  . linagliptin (TRADJENTA) 5 MG TABS tablet Take 5 mg by mouth daily.   09/15/2019  . lisinopril-hydrochlorothiazide (PRINZIDE,ZESTORETIC) 20-12.5 MG per tablet Take 2 tablets by mouth daily.   09/17/2019 at Unknown time  . aspirin EC 81 MG tablet Take 81 mg by mouth daily.   09/13/2019  . atorvastatin (LIPITOR) 20 MG tablet Take 20 mg by mouth daily.     . calcium-vitamin D (OSCAL WITH D) 500-200 MG-UNIT per tablet Take 1 tablet by mouth daily.   09/13/2019  . carvedilol (COREG) 12.5 MG tablet Take 1 tablet by mouth 2 (two) times daily.     . Cholecalciferol (VITAMIN D3) 1000 UNITS CAPS Take 1,000 Units by mouth daily.    09/15/2019  . escitalopram (LEXAPRO) 10 MG tablet Take 10 mg by mouth daily.   09/15/2019  . glimepiride (AMARYL) 2 MG tablet Take 2 mg by mouth daily with breakfast.     . Glucosamine-Chondroit-Vit C-Mn (GLUCOSAMINE CHONDR 1500 COMPLX) CAPS Take 1 capsule by mouth daily.    09/15/2019  . metFORMIN (GLUCOPHAGE-XR) 750 MG 24 hr tablet Take 2 tablets by mouth daily with supper.   09/15/2019  . Multiple Vitamin (MULTIVITAMIN) capsule Take 1 capsule by mouth daily.    09/17/2019   Allergies  Allergen Reactions  . Bacitracin Swelling  . Tape Rash   Past Medical History:  Diagnosis Date  . AICD (automatic cardioverter/defibrillator) present    Biventricular ICD  . Breast cancer ([place]) 2004   left breast with rad tx.   . Cardiomyopathy ([place])   . CHF (congestive heart failure) ([place])   . Coronary artery disease   . Diabetes mellitus without complication ([place])   . Heart murmur   . Hepatitis 1981   Hepatitis A  . Hyperlipemia   . Hypertension   . Left bundle branch block    has defibrilator and pacemaker  . Osteopenia   . Personal  history of radiation therapy 2004   Left breast   Past Surgical History:  Procedure Laterality Date  . ABDOMINAL HYSTERECTOMY    . 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 BREAST LUMPECTOMY Left 2004   lumpectomy f/u with radiation, sentinal node biopsy   . COLONOSCOPY    . COLONOSCOPY WITH PROPOFOL N/A 05/27/2016   Procedure: COLONOSCOPY WITH PROPOFOL;  Surgeon: Manya Silvas, MD;  Location: [facility] ENDOSCOPY;  Service: Endoscopy;  Laterality: N/A;  . EYE SURGERY Bilateral    cataract and retinal surgery  . IMPLANTABLE CARDIOVERTER DEFIBRILLATOR (ICD) GENERATOR CHANGE Left 05/23/2015   Procedure: ICD GENERATOR CHANGE;  Surgeon: Isaias Cowman, MD;  Location: [facility] ORS;  Service: Cardiovascular;  Laterality: Left;  . INSERTION OF ICD    . MEDIAL PARTIAL KNEE REPLACEMENT Left    Social History   Socioeconomic History  . Marital status: Married    Spouse name: Not on file  . Number of children: Not on file  . Years of education: Not on file  . Highest education level: Not on file  Occupational History  . Not on file  Tobacco Use  . Smoking status: Never Smoker  . Smokeless tobacco: Never Used  Substance and Sexual Activity  . Alcohol use: Yes    Alcohol/week:  0.0 standard drinks    Comment: 1 per month - mixed drink  . Drug use: No  . Sexual activity: Never  Other Topics Concern  . Not on file  Social History Narrative  . Not on file   Social Determinants of Health   Financial Resource Strain:   . Difficulty of Paying Living Expenses:   Food Insecurity:   . Worried About Charity fundraiser in the Last Year:   . Arboriculturist in the Last Year:   Transportation Needs:   . Film/video editor (Medical):   Marland Kitchen Lack of Transportation (Non-Medical):   Physical Activity:   . Days of Exercise per Week:   . Minutes of Exercise per Session:   Stress:   . Feeling of Stress :   Social Connections:   . Frequency of Communication with Friends and Family:   .  Frequency of Social Gatherings with Friends and Family:   . Attends Religious Services:   . Active Member of Clubs or Organizations:   . Attends Archivist Meetings:   Marland Kitchen Marital Status:   Intimate Partner Violence:   . Fear of Current or Ex-Partner:   . Emotionally Abused:   Marland Kitchen Physically Abused:   . Sexually Abused:    Social History   Social History Narrative  . Not on file     ROS: Negative.     PE: HEENT: Negative. Lungs: Clear. Cardio: RR.   Assessment/Plan:  Proceed with planned endoscopy   Forest Gleason [facility] 09/17/2019    .

## 2019-09-17 NOTE — Op Note (Signed)
[facility] Gastroenterology Patient Name: Teresa Levy Procedure Date: 09/17/2019 8:03 AM MRN: [id number] Account #: [id number] Date of Birth: [birth year] Admit Type: Outpatient Age: 74 Room: [facility] ENDO ROOM 1 Gender: Female Note Status: Finalized Procedure:             Colonoscopy Indications:           High risk colon cancer surveillance: Personal history                         of colonic polyps Providers:             Robert Bellow, MD Medicines:             Monitored Anesthesia Care Complications:         No immediate complications. Procedure:             Pre-Anesthesia Assessment:                        - Prior to the procedure, a History and Physical was                         performed, and patient medications, allergies and                         sensitivities were reviewed. The patient's tolerance                         of previous anesthesia was reviewed.                        - The risks and benefits of the procedure and the                         sedation options and risks were discussed with the                         patient. All questions were answered and informed                         consent was obtained.                        After obtaining informed consent, the colonoscope was                         passed under direct vision. Throughout the procedure,                         the patient's blood pressure, pulse, and oxygen                         saturations were monitored continuously. The                         Colonoscope was introduced through the anus and                         advanced to the the cecum, identified by appendiceal  orifice and ileocecal valve. The colonoscopy was                         somewhat difficult due to significant looping and a                         tortuous colon. Successful completion of the procedure                         was aided by using manual pressure. The  patient                         tolerated the procedure well. The quality of the bowel                         preparation was excellent. Findings:      The entire examined colon appeared normal on direct and retroflexion       views. Impression:            - The entire examined colon is normal on direct and                         retroflexion views.                        - No specimens collected. Recommendation:        - Repeat colonoscopy in 5 years for surveillance. Procedure Code(s):     --- Professional ---                        [phone], Colonoscopy, flexible; diagnostic, including                         collection of specimen(s) by brushing or washing, when                         performed (separate procedure) Diagnosis Code(s):     --- Professional ---                        Z86.010, Personal history of colonic polyps CPT copyright 2019 American Medical Association. All rights reserved. The codes documented in this report are preliminary and upon coder review may  be revised to meet current compliance requirements. Robert Bellow, MD 09/17/2019 8:32:55 AM This report has been signed electronically. Number of Addenda: 0 Note Initiated On: 09/17/2019 8:03 AM Scope Withdrawal Time: 0 hours 12 minutes 18 seconds  Total Procedure Duration: 0 hours 26 minutes 45 seconds       [facility]

## 2019-09-17 NOTE — Anesthesia Preprocedure Evaluation (Signed)
Anesthesia Evaluation  Patient identified by MRN, date of birth, ID band Patient awake    Reviewed: Allergy & Precautions, H&P , NPO status , Patient's Chart, lab work & pertinent test results, reviewed documented beta blocker date and time   Airway Mallampati: III   Neck ROM: full    Dental  (+) Poor Dentition   Pulmonary neg pulmonary ROS,    Pulmonary exam normal        Cardiovascular Exercise Tolerance: Poor hypertension, On Medications + CAD and +CHF  negative cardio ROS Normal cardiovascular exam+ dysrhythmias + Cardiac Defibrillator + Valvular Problems/Murmurs  Rhythm:regular Rate:Normal     Neuro/Psych negative neurological ROS  negative psych ROS   GI/Hepatic negative GI ROS, Neg liver ROS, (+) Hepatitis -  Endo/Other  negative endocrine ROSdiabetes, Well Controlled, Type 2, Oral Hypoglycemic Agents  Renal/GU negative Renal ROS  negative genitourinary   Musculoskeletal   Abdominal   Peds  Hematology negative hematology ROS (+)   Anesthesia Other Findings Past Medical History: No date: AICD (automatic cardioverter/defibrillator) present     Comment:  Biventricular ICD 2004: Breast cancer ([place])     Comment:  left breast with rad tx.  No date: Cardiomyopathy [facility]) No date: CHF (congestive heart failure) ([facility]) No date: Coronary artery disease No date: Diabetes mellitus without complication ([facility]) No date: Heart murmur 1981: Hepatitis     Comment:  Hepatitis A No date: Hyperlipemia No date: Hypertension No date: Left bundle branch block     Comment:  has defibrilator and pacemaker No date: Osteopenia 2004: Personal history of radiation therapy     Comment:  Left breast Past Surgical History: No date: ABDOMINAL HYSTERECTOMY 2004: BREAST BIOPSY; Left     Comment:  Mucinous Carcinoma 2004: BREAST LUMPECTOMY; Left     Comment:  lumpectomy f/u with radiation, sentinal node biopsy  No date:  COLONOSCOPY 05/27/2016: COLONOSCOPY WITH PROPOFOL; N/A     Comment:  Procedure: COLONOSCOPY WITH PROPOFOL;  Surgeon: Manya Silvas, MD;  Location: [facility] ENDOSCOPY;  Service:               Endoscopy;  Laterality: N/A; No date: EYE SURGERY; Bilateral     Comment:  cataract and retinal surgery 05/23/2015: IMPLANTABLE CARDIOVERTER DEFIBRILLATOR (ICD) GENERATOR  CHANGE; Left     Comment:  Procedure: ICD GENERATOR CHANGE;  Surgeon: Isaias Cowman, MD;  Location: [facility] ORS;  Service:               Cardiovascular;  Laterality: Left; No date: INSERTION OF ICD No date: MEDIAL PARTIAL KNEE REPLACEMENT; Left BMI    Body Mass Index: 36.84 kg/m     Reproductive/Obstetrics negative OB ROS                             Anesthesia Physical Anesthesia Plan  ASA: III  Anesthesia Plan: General   Post-op Pain Management:    Induction:   PONV Risk Score and Plan:   Airway Management Planned:   Additional Equipment:   Intra-op Plan:   Post-operative Plan:   Informed Consent: I have reviewed the patients History and Physical, chart, labs and discussed the procedure including the risks, benefits and alternatives for the proposed anesthesia with the patient or authorized representative who has indicated his/her understanding and acceptance.  Dental Advisory Given  Plan Discussed with: CRNA  Anesthesia Plan Comments:         Anesthesia Quick Evaluation

## 2020-02-16 ENCOUNTER — Other Ambulatory Visit: Payer: Self-pay | Admitting: Radiology

## 2020-02-16 ENCOUNTER — Other Ambulatory Visit: Payer: Medicare PPO

## 2020-02-16 DIAGNOSIS — Z20822 Contact with and (suspected) exposure to covid-19: Secondary | ICD-10-CM

## 2020-02-18 LAB — SARS-COV-2, NAA 2 DAY TAT

## 2020-02-18 LAB — NOVEL CORONAVIRUS, NAA: SARS-CoV-2, NAA: NOT DETECTED

## 2020-07-26 ENCOUNTER — Other Ambulatory Visit: Payer: Self-pay | Admitting: Family Medicine

## 2020-07-26 DIAGNOSIS — Z1231 Encounter for screening mammogram for malignant neoplasm of breast: Secondary | ICD-10-CM

## 2020-09-12 ENCOUNTER — Other Ambulatory Visit: Payer: Self-pay

## 2020-09-12 ENCOUNTER — Ambulatory Visit
Admission: RE | Admit: 2020-09-12 | Discharge: 2020-09-12 | Disposition: A | Discharge status: Home / Self Care | Payer: Medicare PPO | Source: Ambulatory Visit | Attending: Family Medicine | Admitting: Family Medicine

## 2020-09-12 DIAGNOSIS — Z1231 Encounter for screening mammogram for malignant neoplasm of breast: Secondary | ICD-10-CM | POA: Diagnosis not present

## 2021-05-14 NOTE — Discharge Instructions (Signed)
Instructions after Total Knee Replacement   Terie Lear P. Jodee Wagenaar, Jr., M.D.     Dept. of Orthopaedics & Sports Medicine  [facility]  [address]  [place], [place]  [address]  Phone: [phone]   Fax: [phone]    DIET: Drink plenty of non-alcoholic fluids. Resume your normal diet. Include foods high in fiber.  ACTIVITY:  You may use crutches or a walker with weight-bearing as tolerated, unless instructed otherwise. You may be weaned off of the walker or crutches by your Physical Therapist.  Do NOT place pillows under the knee. Anything placed under the knee could limit your ability to straighten the knee.   Continue doing gentle exercises. Exercising will reduce the pain and swelling, increase motion, and prevent muscle weakness.   Please continue to use the TED compression stockings for 6 weeks. You may remove the stockings at night, but should reapply them in the morning. Do not drive or operate any equipment until instructed.  WOUND CARE:  Continue to use the PolarCare or ice packs periodically to reduce pain and swelling. You may bathe or shower after the staples are removed at the first office visit following surgery.  MEDICATIONS: You may resume your regular medications. Please take the pain medication as prescribed on the medication. Do not take pain medication on an empty stomach. You have been given a prescription for a blood thinner (Lovenox or Coumadin). Please take the medication as instructed. (NOTE: After completing a 2 week course of Lovenox, take one Enteric-coated aspirin once a day. This along with elevation will help reduce the possibility of phlebitis in your operated leg.) Do not drive or drink alcoholic beverages when taking pain medications.  CALL THE OFFICE FOR: Temperature above 101 degrees Excessive bleeding or drainage on the dressing. Excessive swelling, coldness, or paleness of the toes. Persistent nausea and vomiting.  FOLLOW-UP:  You  should have an appointment to return to the office in 10-14 days after surgery. Arrangements have been made for continuation of Physical Therapy (either home therapy or outpatient therapy).   [facility] Department Directory         [email or web address]       [email or web address]          Cardiology  Appointments: [place] - [phone] [place] - [phone]  Endocrinology  Appointments: [place] - [phone] [place] - [phone]  Gastroenterology  Appointments: [place] - [phone] [place] - [phone]        General Surgery   Appointments: [place] - [phone]  Internal Medicine/Family Medicine  Appointments: [place] - [phone] [place] - [phone] [place] - [phone]  Metabolic and Weigh Loss Surgery  Appointments: [place] - [phone]        Neurology  Appointments: [place] - [phone] [place] - [phone]  Neurosurgery  Appointments: [place] - [phone]  Obstetrics & Gynecology  Appointments: [place] - [phone] [place] - [phone]        Pediatrics  Appointments: [place] - [phone] [place] - [phone]  Physiatry  Appointments: [place] -[phone]  Physical Therapy  Appointments: [place] - [phone] [place] - [phone]        Podiatry  Appointments: [place] - [phone] [place] - [phone]  Pulmonology  Appointments: [place] - [phone]  Rheumatology  Appointments: [place] - [phone]        [place] Location: [facility]  [address] [place], [place]  [address]  [place] Location: [facility] [address] [place], [place]  [address]  [place] Location: [facility] [address] [place], [place]  [address]    

## 2021-05-16 ENCOUNTER — Encounter
Admission: RE | Admit: 2021-05-16 | Discharge: 2021-05-16 | Disposition: A | Discharge status: Home / Self Care | Payer: Medicare PPO | Source: Ambulatory Visit | Attending: Orthopedic Surgery | Admitting: Orthopedic Surgery

## 2021-05-16 ENCOUNTER — Other Ambulatory Visit: Payer: Self-pay

## 2021-05-16 VITALS — BP 131/61 | HR 76 | Temp 98.6°F | Resp 16 | Ht 60.0 in | Wt 201.8 lb

## 2021-05-16 DIAGNOSIS — I10 Essential (primary) hypertension: Secondary | ICD-10-CM

## 2021-05-16 DIAGNOSIS — E119 Type 2 diabetes mellitus without complications: Secondary | ICD-10-CM | POA: Insufficient documentation

## 2021-05-16 DIAGNOSIS — M1711 Unilateral primary osteoarthritis, right knee: Secondary | ICD-10-CM | POA: Diagnosis present

## 2021-05-16 DIAGNOSIS — Z01810 Encounter for preprocedural cardiovascular examination: Secondary | ICD-10-CM | POA: Diagnosis not present

## 2021-05-16 DIAGNOSIS — Z01812 Encounter for preprocedural laboratory examination: Secondary | ICD-10-CM

## 2021-05-16 HISTORY — DX: Unspecified osteoarthritis, unspecified site: M19.90

## 2021-05-16 LAB — PROTIME-INR
INR: 1 (ref 0.8–1.2)
Prothrombin Time: 13.2 seconds (ref 11.4–15.2)

## 2021-05-16 LAB — COMPREHENSIVE METABOLIC PANEL
ALT: 25 U/L (ref 0–44)
AST: 22 U/L (ref 15–41)
Albumin: 4 g/dL (ref 3.5–5.0)
Alkaline Phosphatase: 101 U/L (ref 38–126)
Anion gap: 7 (ref 5–15)
BUN: 14 mg/dL (ref 8–23)
CO2: 29 mmol/L (ref 22–32)
Calcium: 9.5 mg/dL (ref 8.9–10.3)
Chloride: 102 mmol/L (ref 98–111)
Creatinine, Ser: 0.63 mg/dL (ref 0.44–1.00)
GFR, Estimated: >60 mL/min (ref >60)
Glucose, Bld: 177 mg/dL — ABNORMAL HIGH (ref 70–99)
Potassium: 3.5 mmol/L (ref 3.5–5.1)
Sodium: 138 mmol/L (ref 135–145)
Total Bilirubin: 1.7 mg/dL — ABNORMAL HIGH (ref 0.3–1.2)
Total Protein: 6.6 g/dL (ref 6.5–8.1)

## 2021-05-16 LAB — CBC
HCT: 37.1 % (ref 36.0–46.0)
Hemoglobin: 12.5 g/dL (ref 12.0–15.0)
MCH: 29.1 pg (ref 26.0–34.0)
MCHC: 33.7 g/dL (ref 30.0–36.0)
MCV: 86.5 fL (ref 80.0–100.0)
Platelets: 181 10*3/uL (ref 150–400)
RBC: 4.29 MIL/uL (ref 3.87–5.11)
RDW: 12.2 % (ref 11.5–15.5)
WBC: 5.5 10*3/uL (ref 4.0–10.5)
nRBC: 0 % (ref 0.0–0.2)

## 2021-05-16 LAB — URINALYSIS, ROUTINE W REFLEX MICROSCOPIC
Bilirubin Urine: NEGATIVE
Glucose, UA: NEGATIVE mg/dL
Hgb urine dipstick: NEGATIVE
Ketones, ur: NEGATIVE mg/dL
Leukocytes,Ua: NEGATIVE
Nitrite: NEGATIVE
Protein, ur: NEGATIVE mg/dL
Specific Gravity, Urine: 1.021 (ref 1.005–1.030)
pH: 5 (ref 5.0–8.0)

## 2021-05-16 LAB — SURGICAL PCR SCREEN
MRSA, PCR: NEGATIVE
Staphylococcus aureus: NEGATIVE

## 2021-05-16 LAB — C-REACTIVE PROTEIN: CRP: 0.6 mg/dL (ref <1.0)

## 2021-05-16 LAB — SEDIMENTATION RATE: Sed Rate: 13 mm/hr (ref 0–30)

## 2021-05-16 LAB — HEMOGLOBIN A1C
Hgb A1c MFr Bld: 7.3 % — ABNORMAL HIGH (ref 4.8–5.6)
Mean Plasma Glucose: 162.81 mg/dL

## 2021-05-16 LAB — APTT: aPTT: 30 seconds (ref 24–36)

## 2021-05-16 NOTE — Patient Instructions (Signed)
Your procedure is scheduled on: Wednesday May 30, 2021. Report to Day Surgery inside [facility]. To find out your arrival time please call [phone] between 1PM - 3PM on Tuesday May 29, 2021.  Remember: Instructions that are not followed completely may result in serious medical risk,  up to and including death, or upon the discretion of your surgeon and anesthesiologist your  surgery may need to be rescheduled.     _X__ 1. Do not eat food after midnight the night before your procedure.                 No chewing gum or hard candies. You may drink clear liquids up to 2 hours                 before you are scheduled to arrive for your surgery- DO not drink clear                 liquids within 2 hours of the start of your surgery.                 Clear Liquids include:  water, clear Gatorade, G2 or                  Gatorade Zero (avoid Red/Purple/Blue), Black Coffee or Tea (Do not add                 anything to coffee or tea).  __X__2.  On the morning of surgery brush your teeth with toothpaste and water, you                may rinse your mouth with mouthwash if you wish.  Do not swallow any toothpaste of mouthwash.     _X__ 3.  No Alcohol for 24 hours before or after surgery.   _X__ 4.  Do Not Smoke or use e-cigarettes For 24 Hours Prior to Your Surgery.                 Do not use any chewable tobacco products for at least 6 hours prior to                 Surgery.  _X__  5.  Do not use any recreational drugs (marijuana, cocaine, heroin, ecstasy, MDMA or other)                For at least one week prior to your surgery.  Combination of these drugs with anesthesia                May have life threatening results.  __X__ 6.  Notify your doctor if there is any change in your medical condition      (cold, fever, infections).     Do not wear jewelry, make-up, hairpins, clips or nail polish. Do not wear lotions, powders, or perfumes. You may wear  deodorant. Do not shave 48 hours prior to surgery. Men may shave face and neck. Do not bring valuables to the hospital.    [facility] is not responsible for any belongings or valuables.  Contacts, dentures or bridgework may not be worn into surgery. Leave your suitcase in the car. After surgery it may be brought to your room. For patients admitted to the hospital, discharge time is determined by your treatment team.   Patients discharged the day of surgery will not be allowed to drive home.   Make arrangements for someone to be with you for the first 24  hours of your Same Day Discharge.   __X__ Take these medicines the morning of surgery with A SIP OF WATER:    1. carvedilol (COREG) 12.5 MG  2. escitalopram (LEXAPRO) 20 MG   3.   4.  5.  6.  ____ Fleet Enema (as directed)   __X__ Use CHG Soap (or wipes) as directed  ____ Use Benzoyl Peroxide Gel as instructed  ____ Use inhalers on the day of surgery  __X__ Stop metFORMIN (GLUCOPHAGE-XR) 750 MG  2 days prior to surgery (Take last dose Sunday 05/27/21)   ____ Take 1/2 of usual insulin dose the night before surgery. No insulin the morning          of surgery.   __X__ Call your provider and ask when to stop aspirin EC 81 MG  before your surgery.   __X__ One Week prior to surgery- Stop Anti-inflammatories such as Ibuprofen, Aleve, Advil, Motrin, meloxicam (MOBIC), diclofenac, etodolac, ketorolac, Toradol, Daypro, piroxicam, Goody's or BC powders. OK TO USE TYLENOL IF NEEDED   __X__ Stop supplements until after surgery.    ____ Bring C-Pap to the hospital.    If you have any questions regarding your pre-procedure instructions,  Please call Pre-admit Testing at [phone].

## 2021-05-23 ENCOUNTER — Encounter: Payer: Self-pay | Admitting: Orthopedic Surgery

## 2021-05-23 NOTE — Pre-Procedure Instructions (Signed)
Patient called to inform this writer about medication changes, and wanted to make sure that she can take these medications with her upcoming surgery, New orders are Increase Trulicity to 1.5 mg weekly.Start Myrbetriq 25 mg daily. Patient informed that it is safe for her to take her medications as ordered based on Anesthesia guidelines , she was informed by this writer that if she has any further questions or concerns to notify her MD.

## 2021-05-23 NOTE — Progress Notes (Addendum)
Perioperative Services  Pre-Admission/Anesthesia Testing Clinical Review  Date: 05/23/21  Patient Demographics:  Name: Teresa Levy DOB:   1946-04-18 MRN:   [id number]  Planned Surgical Procedure(s):    Case: [id number] Date/Time: 05/30/21 0815   Procedure: COMPUTER ASSISTED TOTAL KNEE ARTHROPLASTY (Right: Knee)   Anesthesia type: Choice   Pre-op diagnosis: PRIMARY OSTEOARTHRITIS OF RIGHT KNEE.   Location: [facility] OR ROOM 01 / [place] ORS FOR ANESTHESIA GROUP   Surgeons: Dereck Leep, MD   NOTE: Available PAT nursing documentation and vital signs have been reviewed. Clinical nursing staff has updated patient's PMH/PSHx, current medication list, and drug allergies/intolerances to ensure comprehensive history available to assist in medical decision making as it pertains to the aforementioned surgical procedure and anticipated anesthetic course. Extensive review of available clinical information performed. [place] PMH and PSHx updated with any diagnoses/procedures that  may have been inadvertently omitted during her intake with the pre-admission testing department's nursing staff.  Clinical Discussion:  Teresa Levy is a 75 y.o. female who is submitted for pre-surgical anesthesia review and clearance prior to her undergoing the above procedure. Patient has never been a smoker. Pertinent PMH includes: CAD, CHF, cardiomyopathy (s/p AICD placement), LBBB, cardiac murmur, HTN, HLD, T2DM, OA, LEFT breast cancer (s/p lumpectomy + XRT).  Patient is followed by cardiology Nehemiah Massed, MD). She was last seen in the cardiology clinic on 02/22/2021; notes reviewed.  At the time of her clinic visit, patient doing well overall from a cardiovascular perspective.  She denied any episodes of chest pain, PND, orthopnea, palpitations, significant peripheral edema, vertiginous symptoms, or presyncope/syncope.  Patient does experience exertional shortness of breath related to her underlying  cardiomyopathy/heart failure diagnosis. PMH significant for cardiovascular diagnoses.  Patient incidentally found to have a LBBB on a preoperative twelve-lead ECG back in 2009.  TTE performed at that time revealed a significantly reduced LVEF of 20%.  Subsequent diagnostic left heart catheterization demonstrated normal coronary anatomy with no evidence of obstructive CAD.  Repeat TTE performed and 12/2008 revealed an increase in her EF to 35%.  Patient underwent biventricular AICD placement on 02/23/2009, with a subsequent generator change out performed on 05/23/2015.  Most recent TTE performed on 02/15/2019 revealed a normal left ventricular systolic function with mild LVH; LVEF 55%.  There was mild mitral and tricuspid valve regurgitation.  There was no evidence of a significant transvalvular gradient to suggest stenosis.  Blood pressure elevated at 140/72 on currently prescribed beta-blocker, ACEi, and diuretic therapies.  Patient is on a statin for her HLD diagnosis and ASCVD prevention.  T2DM reasonably controlled on currently prescribed regimen; last Hgb A1c was 7.3% when checked on 05/16/2021. Functional capacity, as defined by DASI, is documented as being </= 4 METS.  No changes were made to her medication regimen.  Patient to follow-up with outpatient cardiology in 9 months or sooner if needed.  Teresa Levy is scheduled for an elective RIGHT TOTAL KNEE ARTHROPLASTY on 05/30/2021 with Dr. Skip Estimable, MD. Given patient's past medical history significant for cardiovascular diagnoses, presurgical cardiac clearance was sought by the PAT team. "The patient is at the lowest risk possible for perioperative cardiovascular complications with the planned procedure.  The overall risk her procedure is low (<1%).  Currently has no evidence active and/or significant angina and/or congestive heart failure. Patient may proceed to surgery without restriction or need for further cardiovascular testing  and an overall LOW risk".  This patient is on daily antiplatelet therapy.  She has been  instructed on recommendations from her cardiologist for holding her daily low-dose ASA for 4 days prior to her procedure with plans to restart as soon as postoperative bleeding risk felt to be minimized by her primary attending surgeon.  Patient is aware that her last dose of ASA should be on 05/25/2021.  Patient denies previous perioperative complications with anesthesia in the past. In review of the available records, it is noted that patient underwent a general anesthetic course here (ASA III) in 08/2019 without documented complications.   Vitals with BMI 05/16/2021 09/17/2019 09/17/2019  Height 5\' 0"  - -  Weight 201 lbs 13 oz - -  BMI 30.86 - -  Systolic [phone]  Diastolic 61 63 62  Pulse 76 68 70    Providers/Specialists:   NOTE: Primary physician provider listed below. Patient may have been seen by APP or partner within same practice.   PROVIDER ROLE / SPECIALTY LAST OV  Hooten, Laurice Record, MD Orthopedics 05/22/2021  Juluis Pitch, MD Primary Care Provider 05/22/2021  Serafina Royals, MD Cardiology 02/22/2021   Allergies:  Bacitracin and Tape  Current Home Medications:   No current facility-administered medications for this encounter.    acetaminophen (TYLENOL) 500 MG tablet   aspirin EC 81 MG tablet   atorvastatin (LIPITOR) 20 MG tablet   Calcium Carbonate-Vitamin D 600-10 MG-MCG TABS   carvedilol (COREG) 12.5 MG tablet   Cholecalciferol (VITAMIN D3) 1000 UNITS CAPS   diclofenac Sodium (VOLTAREN) 1 % GEL   escitalopram (LEXAPRO) 20 MG tablet   glimepiride (AMARYL) 4 MG tablet   Glucosamine-Chondroit-Vit C-Mn (GLUCOSAMINE CHONDR 1500 COMPLX) CAPS   lisinopril-hydrochlorothiazide (PRINZIDE,ZESTORETIC) 20-12.5 MG per tablet   metFORMIN (GLUCOPHAGE-XR) 750 MG 24 hr tablet   Multiple Vitamin (MULTIVITAMIN) capsule   TRULICITY 5.28 UX/3.2GM SOPN   vitamin C (ASCORBIC ACID) 500 MG  tablet   History:   Past Medical History:  Diagnosis Date   Arthritis    Biventricular ICD (implantable cardioverter-defibrillator) in place 02/23/2009   Breast cancer, left ([place]) 2004   a.) s/p lumpectomy + XRT   Cardiomyopathy ([place]) 2009   a.) TTE 2009: EF 20%. b.) TTE 12/2008: EF 35%. c.) BiV ICD implanted. d.) TTE 08/16/2014: EF 55-60%. e.) TTE 02/15/2019: EF 55%.   CHF (congestive heart failure) ([place])    a.) TTE 08/16/2014: EF 55-60%; norm LV function; mod LA enlargement; triv PR; mod MR/TR. b.) TTE 02/15/2019: EF 55%; norm LV function with mild LVH; mild MR/TR.   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   BREAST LUMPECTOMY Left 2004   lumpectomy f/u with radiation, sentinal node biopsy    CARDIAC CATHETERIZATION Left 08/03/2007   Procedure: CARDIAC CATHETERIZATION; Location: [place]; Surgeon: Serafina Royals, MD   COLONOSCOPY     COLONOSCOPY WITH PROPOFOL N/A 05/27/2016   Procedure: COLONOSCOPY WITH PROPOFOL;  Surgeon: Manya Silvas, MD;  Location: [place];  Service: Endoscopy;  Laterality: N/A;   COLONOSCOPY WITH PROPOFOL N/A  09/17/2019   Procedure: COLONOSCOPY WITH PROPOFOL;  Surgeon: Robert Bellow, MD;  Location: [facility] ENDOSCOPY;  Service: Endoscopy;  Laterality: N/A;   EYE SURGERY Bilateral    cataract and retinal surgery   IMPLANTABLE CARDIOVERTER DEFIBRILLATOR (ICD) GENERATOR CHANGE Left 05/23/2015   Procedure: ICD GENERATOR CHANGE;  Surgeon: Isaias Cowman, MD;  Location: [facility] ORS;  Service: Cardiovascular;   Laterality: Left;   INSERTION OF ICD N/A 02/23/2009   Procedure: BIVENTRICULAR ICD IMPLATATION; Location: [facility]; Surgeon: Mylinda Latina, MD   MEDIAL PARTIAL KNEE REPLACEMENT Left    Family History  Problem Relation Age of Onset   Diabetes Father    Diabetes Sister    Diabetes Paternal Aunt    Diabetes Paternal Uncle    Diabetes Maternal Grandmother    Breast cancer Mother 58       pt is unsure   Social History   Tobacco Use   Smoking status: Never   Smokeless tobacco: Never  Vaping Use   Vaping Use: Never used  Substance Use Topics   Alcohol use: Yes    Alcohol/week: 0.0 standard drinks    Comment: 1 per month - mixed drink   Drug use: No    Pertinent Clinical Results:  LABS: Labs reviewed: Acceptable for surgery.  No visits with results within 3 Day(s) from this visit.  Latest known visit with results is:  Hospital Outpatient Visit on 05/16/2021  Component Date Value Ref Range Status   ABO/RH(D) 05/16/2021 B POS   Final   Antibody Screen 05/16/2021 POS   Final   Sample Expiration 05/16/2021 05/30/2021, 2359   Final   Extend sample reason 05/16/2021 NO TRANSFUSIONS OR PREGNANCY IN THE PAST 3 MONTHS   Final   Antibody Identification 43/32/9518    Final                   Value:NON SPECIFIC ANTIBODY REACTIVITY Performed at [facility], [address] [place], [place] [address]    CRP 05/16/2021 0.6  <1.0 mg/dL Final   Performed at [facility], [place] [address], [place], [place] [address]   Hgb A1c MFr Bld 05/16/2021 7.3 (H)  4.8 - 5.6 % Final   Comment: (NOTE) Pre diabetes:          5.7%-6.4%  Diabetes:              >6.4%  Glycemic control for   <7.0% adults with diabetes    Mean Plasma Glucose 05/16/2021 162.81  mg/dL Final   Performed at [facility], [place] [address] [place], [place] [address]   MRSA, PCR 05/16/2021 NEGATIVE  NEGATIVE Final   Staphylococcus aureus 05/16/2021 NEGATIVE  NEGATIVE Final   Comment: (NOTE) The Xpert SA Assay  (FDA approved for NASAL specimens in patients 65 years of age and older), is one component of a comprehensive surveillance program. It is not intended to diagnose infection nor to guide or monitor treatment. Performed at [facility], [address] [place], [place] [address]   Sed Rate 05/16/2021 13  0 - 30 mm/hr Final   Performed at [facility], [address] [place], [place] [address]   WBC 05/16/2021 5.5  4.0 - 10.5 K/uL Final   RBC 05/16/2021 4.29  3.87 - 5.11 MIL/uL Final   Hemoglobin 05/16/2021 12.5  12.0 - 15.0 g/dL Final   HCT 05/16/2021 37.1  36.0 - 46.0 % Final   MCV 05/16/2021 86.5  80.0 - 100.0 fL Final  MCH 05/16/2021 29.1  26.0 - 34.0 pg Final   MCHC 05/16/2021 33.7  30.0 - 36.0 g/dL Final   RDW 05/16/2021 12.2  11.5 - 15.5 % Final   Platelets 05/16/2021 181  150 - 400 K/uL Final   nRBC 05/16/2021 0.0  0.0 - 0.2 % Final   Performed at [facility], [place], [place] [address]   Sodium 05/16/2021 138  135 - 145 mmol/L Final   Potassium 05/16/2021 3.5  3.5 - 5.1 mmol/L Final   Chloride 05/16/2021 102  98 - 111 mmol/L Final   CO2 05/16/2021 29  22 - 32 mmol/L Final   Glucose, Bld 05/16/2021 177 (H)  70 - 99 mg/dL Final   Glucose reference range applies only to samples taken after fasting for at least 8 hours.   BUN 05/16/2021 14  8 - 23 mg/dL Final   Creatinine, Ser 05/16/2021 0.63  0.44 - 1.00 mg/dL Final   Calcium 05/16/2021 9.5  8.9 - 10.3 mg/dL Final   Total Protein 05/16/2021 6.6  6.5 - 8.1 g/dL Final   Albumin 05/16/2021 4.0  3.5 - 5.0 g/dL Final   AST 05/16/2021 22  15 - 41 U/L Final   ALT 05/16/2021 25  0 - 44 U/L Final   Alkaline Phosphatase 05/16/2021 101  38 - 126 U/L Final   Total Bilirubin 05/16/2021 1.7 (H)  0.3 - 1.2 mg/dL Final   GFR, Estimated 05/16/2021 >60  >60 mL/min Final   Comment: (NOTE) Calculated using the CKD-EPI Creatinine Equation (2021)   Anion gap 05/16/2021 7  5 - 15 Final   Performed at  [facility], [address] [place], [place] [address]   Prothrombin Time 05/16/2021 13.2  11.4 - 15.2 seconds Final   INR 05/16/2021 1.0  0.8 - 1.2 Final   Comment: (NOTE) INR goal varies based on device and disease states. Performed at [facility], [address] [place], [place] [address]   aPTT 05/16/2021 30  24 - 36 seconds Final   Performed at [facility], [place], [place] [address]   Color, Urine 05/16/2021 YELLOW (A)  YELLOW Final   APPearance 05/16/2021 HAZY (A)  CLEAR Final   Specific Gravity, Urine 05/16/2021 1.021  1.005 - 1.030 Final   pH 05/16/2021 5.0  5.0 - 8.0 Final   Glucose, UA 05/16/2021 NEGATIVE  NEGATIVE mg/dL Final   Hgb urine dipstick 05/16/2021 NEGATIVE  NEGATIVE Final   Bilirubin Urine 05/16/2021 NEGATIVE  NEGATIVE Final   Ketones, ur 05/16/2021 NEGATIVE  NEGATIVE mg/dL Final   Protein, ur 05/16/2021 NEGATIVE  NEGATIVE mg/dL Final   Nitrite 05/16/2021 NEGATIVE  NEGATIVE Final   Leukocytes,Ua 05/16/2021 NEGATIVE  NEGATIVE Final   Performed at [facility], [address] [place], [place] [address]    ECG: Date: 05/16/2021 Time ECG obtained: 1507 PM Rate: 63 bpm Rhythm:  Atrial sensed ventricular paced rhythm Axis (leads I and aVF): Normal Intervals: PR 154 ms. QRS 134 ms. QTc 462 ms. ST segment and T wave changes: No evidence of acute ST segment elevation or depression Comparison: Similar to previous tracing obtained on 12/29/2017   IMAGING / PROCEDURES: DIAGNOSTIC RADIOGRAPHS OF RIGHT KNEE 3 VIEWS performed on 03/08/2021 Narrowing of the medial cartilage space with associated varus alignment Osteophyte formation is noted Subchondral sclerosis is noted No evidence of fracture or dislocation  TRANSTHORACIC ECHOCARDIOGRAM performed on 02/15/2019 LVEF 55% Normal left ventricular systolic function with mild LVH Normal  right ventricular systolic function Mild MR and TR No AR or PR No  valvular stenosis No pericardial effusion  Impression and Plan:  Teresa Levy has been referred for pre-anesthesia review and clearance prior to her undergoing the planned anesthetic and procedural courses. Available labs, pertinent testing, and imaging results were personally reviewed by me. This patient has been appropriately cleared by cardiology with an overall LOW risk of significant perioperative cardiovascular complications. Completed perioperative prescription for cardiac device management documentation completed by primary cardiology team and placed on patient's chart for review by the surgical/anesthetic team on the day of her procedure.   Based on clinical review performed today (05/23/21), barring any significant acute changes in the patient's overall condition, it is anticipated that she will be able to proceed with the planned surgical intervention. Any acute changes in clinical condition may necessitate her procedure being postponed and/or cancelled. Patient will meet with anesthesia team (MD and/or CRNA) on the day of her procedure for preoperative evaluation/assessment. Questions regarding anesthetic course will be fielded at that time.   Pre-surgical instructions were reviewed with the patient during her PAT appointment and questions were fielded by PAT clinical staff. Patient was advised that if any questions or concerns arise prior to her procedure then she should return a call to PAT and/or her surgeon's office to discuss.  Honor Loh, MSN, APRN, FNP-C, CEN [facility]  Peri-operative Services Nurse Practitioner Phone: [phone] Fax: [phone] 05/23/21 10:13 AM  NOTE: This note has been prepared using Dragon dictation software. Despite my best ability to proofread, there is always the potential that unintentional transcriptional errors may still occur from this process.

## 2021-05-27 NOTE — H&P (Signed)
ORTHOPAEDIC HISTORY & PHYSICAL Gwenlyn Fudge, [place] - 05/22/2021 11:00 AM EST Formatting of this note is different from the original. [place] [facility] Chief Complaint:   Chief Complaint  Patient presents with   Follow-up  H&P Right Total Knee Arthroplasty. Scheduled for 05/30/2021   History of Present Illness:   Teresa Levy is a 75 y.o. female that presents to clinic today for her preoperative history and evaluation. Patient presents unaccompanied. The patient is scheduled to undergo a right total knee arthroplasty on 05/30/21 by Dr. Marry Guan. Her pain began several years ago. The pain is located primarily along the medial aspect of the knee. She describes her pain as worse with weightbearing. She reports associated swelling as well as some giving way of the knee. She denies associated numbness or tingling, denies locking.   The patient's symptoms have progressed to the point that they decrease her quality of life. The patient has previously undergone conservative treatment including NSAIDS and injections to the knee without adequate control of her symptoms.  Denies history of lumbar surgery, denies history of blood clots. Patient does have a pacemaker and defibrillator and is followed by Dr Nehemiah Massed. She has received cardiac clearance.   Patient is diabetic. Last A1C was 7.3.   Past Medical, Surgical, Family, Social History, Allergies, Medications:   Past Medical History:  Past Medical History:  Diagnosis Date   Allergic rhinitis   Benign essential hypertension 04/17/2015   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   Cardiac defibrillator in place   Cardiomyopathy, secondary (CMS-HCC)   Carotid artery occlusion   Chronic systolic CHF (congestive heart failure), NYHA class 2 (CMS-HCC) 06/07/2015  With ef now improved to normal   COVID-19 07/20/2019   History of breast cancer   History of  cardiomyopathy  Echocardiogram 08/04/12, normal LV function, EF of 50%. Moderate MR and TR. Defibrillator, September, 2010.   History of hemorrhoids   History of hepatitis A   History of left bundle branch block (LBBB)   Hypertension   Macular hole of both eyes  h/o   Osteopenia   Other and unspecified hyperlipidemia   Type 2 diabetes mellitus without complication (CMS-HCC) 06/27/9700   Past Surgical History:  Past Surgical History:  Procedure Laterality Date   CATARACT EXTRACTION   COLONOSCOPY 05/31/2005  Colon Polyps 2001 (Dr. Mikki Santee MD)   COLONOSCOPY 10/19/2010  [place] Colon Polyps, [facility] (Sister): CBF 09/2015; Recall Ltr mailed 08/28/2015 (dw)   COLONOSCOPY 05/27/2016  Adenomatous Polyps, [place] (Sister): CBF 05/2019   COLONOSCOPY 09/17/2019  Normal colon/PHx CP/Repeat 53yrs/JWB   Defibrillator 2017   HYSTERECTOMY   Left knee partial replacement 2008   LENS EYE SURGERY Bilateral 2012  s/p CE/PCIOL OU at [facility] Repair Bilateral  with Dr. Myles Gip   MASTECTOMY PARTIAL / LUMPECTOMY  left   TONSILLECTOMY   Current Medications:  Current Outpatient Medications  Medication Sig Dispense Refill   acetaminophen (TYLENOL) 500 MG tablet Take 1,000 mg by mouth every 6 (six) hours as needed for moderate pain.   ascorbic acid, vitamin C, 500 mg Chew Take by mouth Take 500 mg by mouth daily.   aspirin 81 MG EC tablet Take 81 mg by mouth daily.   atorvastatin (LIPITOR) 20 MG tablet Take 1 tablet (20 mg total) by mouth once daily 90 tablet 3   blood glucose diagnostic (ACCU-CHEK SMARTVIEW TEST STRIP) test strip Check blood sugars two to three times a day 200  each 11   calcium carbonate-vitamin D3 (OS-CAL 500+D) 500 mg(1,250mg ) -200 unit tablet Take 1 tablet by mouth once daily.   carvediloL (COREG) 12.5 MG tablet Take 1 tablet (12.5 mg total) by mouth 2 (two) times daily with meals 180 tablet 3   cholecalciferol (VITAMIN D3) 1,000 unit capsule Take 1,000 Units by mouth  once daily.   diclofenac (VOLTAREN) 1 % topical gel Apply topically Apply 2 g topically daily as needed (knee pain).   dulaglutide (TRULICITY) 1.5 ZH/2.9 mL subcutaneous pen injector Inject 0.5 mLs (1.5 mg total) subcutaneously every 7 (seven) days for 30 days 6 mL 0   escitalopram oxalate (LEXAPRO) 20 MG tablet Take 0.5 tablets (10 mg total) by mouth 2 (two) times daily 30 tablet 0   glimepiride (AMARYL) 4 MG tablet Take 1 tablet (4 mg total) by mouth 2 (two) times daily 60 tablet 0   GLUC HCL/GLUC SU/AC-D-GLUCOS (GLUCOSAMINE COMPLEX ORAL) Take 1 tablet by mouth daily.   glucosamine HCl/chondroitin su (GLUCOSAMINE-CHONDROITIN ORAL) Take 1 tablet by mouth once daily   lancets (ONETOUCH DELICA LANCETS) 30 gauge Misc Use 1 Lancet 2 (two) times daily. 200 each 3   lisinopriL-hydrochlorothiazide (ZESTORETIC) 20-12.5 mg tablet Take 2 tablets by mouth once daily 60 tablet 0   metFORMIN (GLUCOPHAGE-XR) 750 MG XR tablet Take 1 tablet (750 mg total) by mouth daily with dinner 90 tablet 1   mirabegron (MYRBETRIQ) 25 mg ER Tablet Take 1 tablet (25 mg total) by mouth once daily 30 tablet 0   multivitamin capsule Take 1 capsule by mouth daily.   dulaglutide (TRULICITY) 9.24 QA/8.3 mL pen injector Inject 0.5 mLs (0.75 mg total) subcutaneously once a week for 90 days 6 mL 3   No current facility-administered medications for this visit.   Allergies:  Allergies  Allergen Reactions   Adhesive Tape-Silicones Rash   Bacitracin Swelling  Eye ointment   Social History:  Social History   Socioeconomic History   Marital status: Married  Spouse name: John   Number of children: 2   Years of education: 12+  Occupational History   Occupation: Clinical research associate  Tobacco Use   Smoking status: Never   Smokeless tobacco: Never  Vaping Use   Vaping Use: Never used  Substance and Sexual Activity   Alcohol use: Yes  Alcohol/week: 0.0 standard drinks  Comment: 1-2 drinks monthly   Drug use: No  Social History Narrative   She takes Calcium and Vitamin D 1 tablet everyday. <1 serving of dairy products daily. Vit D 24.0, 10/07/13.  Exercise: None.  Diet: Red meat frequently. Fast foods occasionally. Fried foods occasionally.  Pelvix: 2019   Family History:  Family History  Problem Relation Age of Onset   High blood pressure (Hypertension) Mother   Breast cancer Mother   Lung cancer Mother   Irregular Heart Beat (Arrhythmia) Mother   Stroke Father   Alzheimer's disease Father   Lung cancer Father   Diabetes Other  grandparents   Colon cancer Other  sibling   Osteoporosis (Thinning of bones) Other  grandmother   Diabetes type II Maternal Grandmother   Diabetes type II Maternal Grandfather   Diabetes type II Paternal Grandmother   Diabetes type II Paternal Grandfather   Colon cancer Sister 41   Cancer Sister  Bladder   Diabetes Sister   Rheum arthritis Sister   Multiple sclerosis Sister   Bipolar disorder Sister   Muscular dystrophy Sister   No Known Problems Son   Epilepsy Daughter  Review of Systems:   A 10+ ROS was performed, reviewed, and the pertinent orthopaedic findings are documented in the HPI.   Physical Examination:   BP (!) 140/80  Ht 154.9 cm (5\' 1" )  Wt 91.8 kg (202 lb 6.4 oz)  BMI 38.24 kg/m   Patient is a well-developed, well-nourished female in no acute distress. Patient has normal mood and affect. Patient is alert and oriented to person, place, and time.   HEENT: Atraumatic, normocephalic. Pupils equal and reactive to light. Extraocular motion intact. Noninjected sclera.  Cardiovascular: Regular rate and rhythm, with no murmurs, rubs, or gallops. Distal pulses palpable.  Respiratory: Lungs clear to auscultation bilaterally.   Right Knee: Soft tissue swelling: mild Effusion: none Erythema: none Crepitance: mild Tenderness: medial Alignment: relative varus Mediolateral laxity: medial pseudolaxity Posterior sag: negative Patellar tracking: Good tracking  without evidence of subluxation or tilt Atrophy: No significant atrophy.  Quadriceps tone was fair to good. Range of motion: 0/13/123 degrees   Sensation intact over the saphenous, lateral sural cutaneous, superficial fibular, and deep fibular nerve distributions.  Tests Performed/Reviewed:  X-rays  3 views of the right knee were reviewed. Images reveal severe loss of medial compartment joint space with bone-on-bone contact, subchondral sclerosis, and significant osteophyte formation. No fractures or dislocations. No osseous abnormality noted.  Impression:   ICD-10-CM  1. Primary osteoarthritis of right knee M17.11   Plan:   The patient has end-stage degenerative changes of the right knee. It was explained to the patient that the condition is progressive in nature. Having failed conservative treatment, the patient has elected to proceed with a total joint arthroplasty. The patient will undergo a total joint arthroplasty with Dr. Marry Guan. The risks of surgery, including blood clot and infection, were discussed with the patient. Measures to reduce these risks, including the use of anticoagulation, perioperative antibiotics, and early ambulation were discussed. The importance of postoperative physical therapy was discussed with the patient. The patient elects to proceed with surgery. The patient is instructed to stop all blood thinners prior to surgery. The patient is instructed to call the hospital the day before surgery to learn of the proper arrival time.   Contact our office with any questions or concerns. Follow up as indicated, or sooner should any new problems arise, if conditions worsen, or if they are otherwise concerned.   Gwenlyn Fudge, [facility] [place], [place] [address] Phone: [phone]  This note was generated in part with voice recognition software and I apologize for any typographical errors that were  not detected and corrected.  Electronically signed by Gwenlyn Fudge, PA at 05/22/2021 12:26 PM EST

## 2021-05-28 ENCOUNTER — Other Ambulatory Visit
Admission: RE | Admit: 2021-05-28 | Discharge: 2021-05-28 | Disposition: A | Discharge status: Home / Self Care | Payer: Medicare PPO | Source: Ambulatory Visit | Attending: Orthopedic Surgery | Admitting: Orthopedic Surgery

## 2021-05-28 DIAGNOSIS — Z01812 Encounter for preprocedural laboratory examination: Secondary | ICD-10-CM | POA: Insufficient documentation

## 2021-05-28 DIAGNOSIS — Z20822 Contact with and (suspected) exposure to covid-19: Secondary | ICD-10-CM | POA: Insufficient documentation

## 2021-05-29 LAB — SARS CORONAVIRUS 2 (TAT 6-24 HRS): SARS Coronavirus 2: NEGATIVE

## 2021-05-30 ENCOUNTER — Ambulatory Visit: Payer: Medicare PPO | Admitting: Urgent Care

## 2021-05-30 ENCOUNTER — Observation Stay: Payer: Medicare PPO

## 2021-05-30 ENCOUNTER — Encounter: Payer: Self-pay | Admitting: Orthopedic Surgery

## 2021-05-30 ENCOUNTER — Encounter
Admission: RE | Disposition: A | Discharge status: Home / Self Care | Payer: Self-pay | Source: Home / Self Care | Attending: Orthopedic Surgery

## 2021-05-30 ENCOUNTER — Other Ambulatory Visit: Payer: Self-pay

## 2021-05-30 ENCOUNTER — Observation Stay
Admission: RE | Admit: 2021-05-30 | Discharge: 2021-05-31 | Disposition: A | Discharge status: Home / Self Care | Payer: Medicare PPO | Attending: Orthopedic Surgery | Admitting: Orthopedic Surgery

## 2021-05-30 DIAGNOSIS — E119 Type 2 diabetes mellitus without complications: Secondary | ICD-10-CM | POA: Diagnosis not present

## 2021-05-30 DIAGNOSIS — Z853 Personal history of malignant neoplasm of breast: Secondary | ICD-10-CM | POA: Diagnosis not present

## 2021-05-30 DIAGNOSIS — Z8616 Personal history of COVID-19: Secondary | ICD-10-CM | POA: Diagnosis not present

## 2021-05-30 DIAGNOSIS — I5022 Chronic systolic (congestive) heart failure: Secondary | ICD-10-CM | POA: Diagnosis not present

## 2021-05-30 DIAGNOSIS — Z79899 Other long term (current) drug therapy: Secondary | ICD-10-CM | POA: Diagnosis not present

## 2021-05-30 DIAGNOSIS — Z794 Long term (current) use of insulin: Secondary | ICD-10-CM | POA: Diagnosis not present

## 2021-05-30 DIAGNOSIS — Z96659 Presence of unspecified artificial knee joint: Secondary | ICD-10-CM

## 2021-05-30 DIAGNOSIS — Z7982 Long term (current) use of aspirin: Secondary | ICD-10-CM | POA: Insufficient documentation

## 2021-05-30 DIAGNOSIS — Z7984 Long term (current) use of oral hypoglycemic drugs: Secondary | ICD-10-CM | POA: Insufficient documentation

## 2021-05-30 DIAGNOSIS — Z7985 Long-term (current) use of injectable non-insulin antidiabetic drugs: Secondary | ICD-10-CM | POA: Insufficient documentation

## 2021-05-30 DIAGNOSIS — I110 Hypertensive heart disease with heart failure: Secondary | ICD-10-CM | POA: Diagnosis not present

## 2021-05-30 DIAGNOSIS — M1711 Unilateral primary osteoarthritis, right knee: Principal | ICD-10-CM | POA: Insufficient documentation

## 2021-05-30 DIAGNOSIS — Z95810 Presence of automatic (implantable) cardiac defibrillator: Secondary | ICD-10-CM | POA: Diagnosis not present

## 2021-05-30 DIAGNOSIS — I10 Essential (primary) hypertension: Secondary | ICD-10-CM

## 2021-05-30 HISTORY — DX: Unspecified macular degeneration: H35.30

## 2021-05-30 HISTORY — PX: KNEE ARTHROPLASTY: SHX992

## 2021-05-30 HISTORY — DX: Type 2 diabetes mellitus without complications: E11.9

## 2021-05-30 HISTORY — DX: Unspecified hemorrhoids: K64.9

## 2021-05-30 LAB — GLUCOSE, CAPILLARY
Glucose-Capillary: 180 mg/dL — ABNORMAL HIGH (ref 70–99)
Glucose-Capillary: 224 mg/dL — ABNORMAL HIGH (ref 70–99)
Glucose-Capillary: 210 mg/dL — ABNORMAL HIGH (ref 70–99)
Glucose-Capillary: 346 mg/dL — ABNORMAL HIGH (ref 70–99)
Glucose-Capillary: 364 mg/dL — ABNORMAL HIGH (ref 70–99)

## 2021-05-30 LAB — ABO/RH: ABO/RH(D): B POS

## 2021-05-30 SURGERY — ARTHROPLASTY, KNEE, TOTAL, USING IMAGELESS COMPUTER-ASSISTED NAVIGATION
Anesthesia: General | Site: Knee | Laterality: Right

## 2021-05-30 MED ORDER — DEXAMETHASONE SODIUM PHOSPHATE 10 MG/ML IJ SOLN
INTRAMUSCULAR | Status: AC
  Filled 2021-05-30: qty 1

## 2021-05-30 MED ORDER — PHENYLEPHRINE HCL-NACL 20-0.9 MG/250ML-% IV SOLN
INTRAVENOUS | Status: DC | PRN
Start: 2021-05-30 — End: 2021-05-30
  Administered 2021-05-30: 40 ug/min via INTRAVENOUS

## 2021-05-30 MED ORDER — ALUM & MAG HYDROXIDE-SIMETH 200-200-20 MG/5ML PO SUSP: 30.0000 mL | ORAL | Status: DC | PRN

## 2021-05-30 MED ORDER — ORAL CARE MOUTH RINSE: 15.0000 mL | Freq: Once | OROMUCOSAL | Status: DC

## 2021-05-30 MED ORDER — SODIUM CHLORIDE 0.9 % IV SOLN: INTRAVENOUS | Status: DC

## 2021-05-30 MED ORDER — OXYCODONE HCL 5 MG PO TABS: 5.0000 mg | ORAL_TABLET | ORAL | Status: DC | PRN

## 2021-05-30 MED ORDER — ACETAMINOPHEN 10 MG/ML IV SOLN
INTRAVENOUS | Status: AC
  Filled 2021-05-30: qty 100

## 2021-05-30 MED ORDER — SODIUM CHLORIDE 0.9 % IV SOLN: INTRAVENOUS | Status: DC | PRN

## 2021-05-30 MED ORDER — CELECOXIB 200 MG PO CAPS
ORAL_CAPSULE | ORAL | Status: AC
  Administered 2021-05-30: 400 mg via ORAL
  Filled 2021-05-30: qty 2

## 2021-05-30 MED ORDER — ESCITALOPRAM OXALATE 20 MG PO TABS
20.0000 mg | ORAL_TABLET | Freq: Every day | ORAL | Status: DC
  Administered 2021-05-31: 20 mg via ORAL
  Filled 2021-05-30: qty 1

## 2021-05-30 MED ORDER — TRANEXAMIC ACID-NACL 1000-0.7 MG/100ML-% IV SOLN: 1000.0000 mg | Freq: Once | INTRAVENOUS | Status: AC

## 2021-05-30 MED ORDER — CEFAZOLIN SODIUM-DEXTROSE 2-4 GM/100ML-% IV SOLN
2.0000 g | Freq: Four times a day (QID) | INTRAVENOUS | Status: AC
  Administered 2021-05-30 (×2): 2 g via INTRAVENOUS
  Filled 2021-05-30 (×2): qty 100

## 2021-05-30 MED ORDER — FERROUS SULFATE 325 (65 FE) MG PO TABS
325.0000 mg | ORAL_TABLET | Freq: Two times a day (BID) | ORAL | Status: DC
  Administered 2021-05-31: 325 mg via ORAL
  Filled 2021-05-30: qty 1

## 2021-05-30 MED ORDER — ATORVASTATIN CALCIUM 20 MG PO TABS
20.0000 mg | ORAL_TABLET | Freq: Every day | ORAL | Status: DC
  Administered 2021-05-30: 20 mg via ORAL
  Filled 2021-05-30: qty 1

## 2021-05-30 MED ORDER — SODIUM CHLORIDE (PF) 0.9 % IJ SOLN
INTRAMUSCULAR | Status: DC | PRN
  Administered 2021-05-30: 120 mL

## 2021-05-30 MED ORDER — TRANEXAMIC ACID-NACL 1000-0.7 MG/100ML-% IV SOLN: 1000.0000 mg | INTRAVENOUS | Status: DC

## 2021-05-30 MED ORDER — OYSTER SHELL CALCIUM/D3 500-5 MG-MCG PO TABS
1.0000 | ORAL_TABLET | Freq: Every day | ORAL | Status: DC
  Administered 2021-05-31: 1 via ORAL
  Filled 2021-05-30: qty 1

## 2021-05-30 MED ORDER — CEFAZOLIN SODIUM-DEXTROSE 2-4 GM/100ML-% IV SOLN
INTRAVENOUS | Status: AC
  Filled 2021-05-30: qty 100

## 2021-05-30 MED ORDER — BISACODYL 10 MG RE SUPP: 10.0000 mg | Freq: Every day | RECTAL | Status: DC | PRN

## 2021-05-30 MED ORDER — MIDAZOLAM HCL 5 MG/5ML IJ SOLN
INTRAMUSCULAR | Status: DC | PRN
  Administered 2021-05-30: 1 mg via INTRAVENOUS

## 2021-05-30 MED ORDER — INSULIN ASPART 100 UNIT/ML IJ SOLN
0.0000 [IU] | Freq: Three times a day (TID) | INTRAMUSCULAR | Status: DC
  Administered 2021-05-30: 11 [IU] via SUBCUTANEOUS
  Administered 2021-05-31 (×2): 5 [IU] via SUBCUTANEOUS
  Filled 2021-05-30 (×3): qty 1

## 2021-05-30 MED ORDER — METOCLOPRAMIDE HCL 10 MG PO TABS
10.0000 mg | ORAL_TABLET | Freq: Three times a day (TID) | ORAL | Status: DC
  Administered 2021-05-30 – 2021-05-31 (×4): 10 mg via ORAL
  Filled 2021-05-30 (×4): qty 1

## 2021-05-30 MED ORDER — MIDAZOLAM HCL 2 MG/2ML IJ SOLN
INTRAMUSCULAR | Status: AC
  Filled 2021-05-30: qty 2

## 2021-05-30 MED ORDER — TRANEXAMIC ACID-NACL 1000-0.7 MG/100ML-% IV SOLN
INTRAVENOUS | Status: DC | PRN
  Administered 2021-05-30: 1000 mg via INTRAVENOUS

## 2021-05-30 MED ORDER — OXYCODONE HCL 5 MG PO TABS
10.0000 mg | ORAL_TABLET | ORAL | Status: DC | PRN
  Administered 2021-05-30 – 2021-05-31 (×2): 10 mg via ORAL
  Filled 2021-05-30 (×2): qty 2

## 2021-05-30 MED ORDER — CHLORHEXIDINE GLUCONATE 0.12 % MT SOLN
OROMUCOSAL | Status: AC
  Administered 2021-05-30: 15 mL via OROMUCOSAL
  Filled 2021-05-30: qty 15

## 2021-05-30 MED ORDER — CARVEDILOL 12.5 MG PO TABS
12.5000 mg | ORAL_TABLET | Freq: Two times a day (BID) | ORAL | Status: DC
  Administered 2021-05-30 – 2021-05-31 (×2): 12.5 mg via ORAL
  Filled 2021-05-30 (×2): qty 1

## 2021-05-30 MED ORDER — LISINOPRIL-HYDROCHLOROTHIAZIDE 20-12.5 MG PO TABS: 2.0000 | ORAL_TABLET | Freq: Every day | ORAL | Status: DC

## 2021-05-30 MED ORDER — PRONTOSAN WOUND IRRIGATION OPTIME
TOPICAL | Status: DC | PRN
  Administered 2021-05-30: 1 via TOPICAL

## 2021-05-30 MED ORDER — ONDANSETRON HCL 4 MG/2ML IJ SOLN: 4.0000 mg | Freq: Once | INTRAMUSCULAR | Status: DC | PRN

## 2021-05-30 MED ORDER — DEXAMETHASONE SODIUM PHOSPHATE 10 MG/ML IJ SOLN
INTRAMUSCULAR | Status: AC
  Administered 2021-05-30: 8 mg via INTRAVENOUS
  Filled 2021-05-30: qty 1

## 2021-05-30 MED ORDER — PANTOPRAZOLE SODIUM 40 MG PO TBEC
40.0000 mg | DELAYED_RELEASE_TABLET | Freq: Two times a day (BID) | ORAL | Status: DC
  Administered 2021-05-30 – 2021-05-31 (×2): 40 mg via ORAL
  Filled 2021-05-30 (×2): qty 1

## 2021-05-30 MED ORDER — CELECOXIB 200 MG PO CAPS
200.0000 mg | ORAL_CAPSULE | Freq: Two times a day (BID) | ORAL | Status: DC
  Administered 2021-05-30 – 2021-05-31 (×3): 200 mg via ORAL
  Filled 2021-05-30 (×3): qty 1

## 2021-05-30 MED ORDER — DEXAMETHASONE SODIUM PHOSPHATE 10 MG/ML IJ SOLN: 8.0000 mg | Freq: Once | INTRAMUSCULAR | Status: DC

## 2021-05-30 MED ORDER — ONDANSETRON HCL 4 MG/2ML IJ SOLN: 4.0000 mg | Freq: Four times a day (QID) | INTRAMUSCULAR | Status: DC | PRN

## 2021-05-30 MED ORDER — LISINOPRIL 20 MG PO TABS
20.0000 mg | ORAL_TABLET | Freq: Every day | ORAL | Status: DC
  Administered 2021-05-31: 20 mg via ORAL
  Filled 2021-05-30: qty 1

## 2021-05-30 MED ORDER — ONDANSETRON HCL 4 MG/2ML IJ SOLN
INTRAMUSCULAR | Status: DC | PRN
  Administered 2021-05-30: 4 mg via INTRAVENOUS

## 2021-05-30 MED ORDER — INSULIN ASPART 100 UNIT/ML IJ SOLN
5.0000 [IU] | Freq: Once | INTRAMUSCULAR | Status: AC
  Administered 2021-05-30: 5 [IU] via SUBCUTANEOUS

## 2021-05-30 MED ORDER — MENTHOL 3 MG MT LOZG
1.0000 | LOZENGE | OROMUCOSAL | Status: DC | PRN
  Filled 2021-05-30: qty 9

## 2021-05-30 MED ORDER — GABAPENTIN 300 MG PO CAPS: 300.0000 mg | ORAL_CAPSULE | Freq: Once | ORAL | Status: DC

## 2021-05-30 MED ORDER — OXYCODONE HCL 5 MG PO TABS: 5.0000 mg | ORAL_TABLET | Freq: Once | ORAL | Status: DC | PRN

## 2021-05-30 MED ORDER — OXYCODONE HCL 5 MG/5ML PO SOLN: 5.0000 mg | Freq: Once | ORAL | Status: DC | PRN

## 2021-05-30 MED ORDER — BUPIVACAINE LIPOSOME 1.3 % IJ SUSP
INTRAMUSCULAR | Status: AC
  Filled 2021-05-30: qty 20

## 2021-05-30 MED ORDER — METFORMIN HCL ER 750 MG PO TB24
750.0000 mg | ORAL_TABLET | Freq: Every day | ORAL | Status: DC
  Administered 2021-05-30: 750 mg via ORAL
  Filled 2021-05-30 (×2): qty 1

## 2021-05-30 MED ORDER — PROPOFOL 1000 MG/100ML IV EMUL
INTRAVENOUS | Status: AC
  Filled 2021-05-30: qty 100

## 2021-05-30 MED ORDER — FENTANYL CITRATE (PF) 100 MCG/2ML IJ SOLN: 25.0000 ug | INTRAMUSCULAR | Status: DC | PRN

## 2021-05-30 MED ORDER — CELECOXIB 200 MG PO CAPS: 400.0000 mg | ORAL_CAPSULE | Freq: Once | ORAL | Status: DC

## 2021-05-30 MED ORDER — HYDROMORPHONE HCL 1 MG/ML IJ SOLN: 0.5000 mg | INTRAMUSCULAR | Status: DC | PRN

## 2021-05-30 MED ORDER — DULAGLUTIDE 0.75 MG/0.5ML ~~LOC~~ SOAJ: 1.5000 mg | SUBCUTANEOUS | Status: DC

## 2021-05-30 MED ORDER — PHENYLEPHRINE HCL (PRESSORS) 10 MG/ML IV SOLN
INTRAVENOUS | Status: AC
  Filled 2021-05-30: qty 1

## 2021-05-30 MED ORDER — GABAPENTIN 300 MG PO CAPS
ORAL_CAPSULE | ORAL | Status: AC
  Administered 2021-05-30: 300 mg via ORAL
  Filled 2021-05-30: qty 1

## 2021-05-30 MED ORDER — ACETAMINOPHEN 325 MG PO TABS: 325.0000 mg | ORAL_TABLET | Freq: Four times a day (QID) | ORAL | Status: DC | PRN

## 2021-05-30 MED ORDER — TRANEXAMIC ACID-NACL 1000-0.7 MG/100ML-% IV SOLN
INTRAVENOUS | Status: AC
  Administered 2021-05-30: 1000 mg via INTRAVENOUS
  Filled 2021-05-30: qty 100

## 2021-05-30 MED ORDER — ENOXAPARIN SODIUM 30 MG/0.3ML IJ SOSY
30.0000 mg | PREFILLED_SYRINGE | Freq: Two times a day (BID) | INTRAMUSCULAR | Status: DC
  Administered 2021-05-31: 30 mg via SUBCUTANEOUS
  Filled 2021-05-30: qty 0.3

## 2021-05-30 MED ORDER — CHLORHEXIDINE GLUCONATE 4 % EX LIQD: 60.0000 mL | Freq: Once | CUTANEOUS | Status: DC

## 2021-05-30 MED ORDER — SODIUM CHLORIDE 0.9 % IR SOLN
Status: DC | PRN
  Administered 2021-05-30: 3000 mL

## 2021-05-30 MED ORDER — ACETAMINOPHEN 10 MG/ML IV SOLN
1000.0000 mg | Freq: Four times a day (QID) | INTRAVENOUS | Status: DC
  Administered 2021-05-30 (×2): 1000 mg via INTRAVENOUS
  Filled 2021-05-30 (×4): qty 100

## 2021-05-30 MED ORDER — MAGNESIUM HYDROXIDE 400 MG/5ML PO SUSP
30.0000 mL | Freq: Every day | ORAL | Status: DC
  Administered 2021-05-31: 30 mL via ORAL
  Filled 2021-05-30: qty 30

## 2021-05-30 MED ORDER — PROPOFOL 500 MG/50ML IV EMUL
INTRAVENOUS | Status: DC | PRN
  Administered 2021-05-30: 100 ug/kg/min via INTRAVENOUS

## 2021-05-30 MED ORDER — CEFAZOLIN SODIUM-DEXTROSE 2-3 GM-%(50ML) IV SOLR
INTRAVENOUS | Status: DC | PRN
  Administered 2021-05-30: 2 g via INTRAVENOUS

## 2021-05-30 MED ORDER — BUPIVACAINE HCL (PF) 0.5 % IJ SOLN
INTRAMUSCULAR | Status: DC | PRN
  Administered 2021-05-30: 3 mL

## 2021-05-30 MED ORDER — FENTANYL CITRATE (PF) 100 MCG/2ML IJ SOLN
INTRAMUSCULAR | Status: AC
  Filled 2021-05-30: qty 2

## 2021-05-30 MED ORDER — ADULT MULTIVITAMIN W/MINERALS CH
1.0000 | ORAL_TABLET | Freq: Every day | ORAL | Status: DC
  Administered 2021-05-31: 1 via ORAL
  Filled 2021-05-30: qty 1

## 2021-05-30 MED ORDER — ONDANSETRON HCL 4 MG PO TABS: 4.0000 mg | ORAL_TABLET | Freq: Four times a day (QID) | ORAL | Status: DC | PRN

## 2021-05-30 MED ORDER — BUPIVACAINE-EPINEPHRINE (PF) 0.25% -1:200000 IJ SOLN
INTRAMUSCULAR | Status: AC
  Filled 2021-05-30: qty 30

## 2021-05-30 MED ORDER — GLIMEPIRIDE 4 MG PO TABS
4.0000 mg | ORAL_TABLET | Freq: Two times a day (BID) | ORAL | Status: DC
  Administered 2021-05-30 – 2021-05-31 (×2): 4 mg via ORAL
  Filled 2021-05-30 (×4): qty 1

## 2021-05-30 MED ORDER — HYDROCHLOROTHIAZIDE 12.5 MG PO TABS
12.5000 mg | ORAL_TABLET | Freq: Every day | ORAL | Status: DC
  Administered 2021-05-31: 12.5 mg via ORAL
  Filled 2021-05-30: qty 1

## 2021-05-30 MED ORDER — ACETAMINOPHEN 10 MG/ML IV SOLN: 1000.0000 mg | Freq: Once | INTRAVENOUS | Status: DC | PRN

## 2021-05-30 MED ORDER — ASCORBIC ACID 500 MG PO TABS
500.0000 mg | ORAL_TABLET | Freq: Every day | ORAL | Status: DC
  Administered 2021-05-31: 500 mg via ORAL
  Filled 2021-05-30: qty 1

## 2021-05-30 MED ORDER — 0.9 % SODIUM CHLORIDE (POUR BTL) OPTIME
TOPICAL | Status: DC | PRN
  Administered 2021-05-30: 100 mL

## 2021-05-30 MED ORDER — SENNOSIDES-DOCUSATE SODIUM 8.6-50 MG PO TABS
1.0000 | ORAL_TABLET | Freq: Two times a day (BID) | ORAL | Status: DC
  Administered 2021-05-30 – 2021-05-31 (×2): 1 via ORAL
  Filled 2021-05-30 (×2): qty 1

## 2021-05-30 MED ORDER — TRANEXAMIC ACID-NACL 1000-0.7 MG/100ML-% IV SOLN
INTRAVENOUS | Status: AC
  Filled 2021-05-30: qty 100

## 2021-05-30 MED ORDER — CHLORHEXIDINE GLUCONATE 0.12 % MT SOLN: 15.0000 mL | Freq: Once | OROMUCOSAL | Status: DC

## 2021-05-30 MED ORDER — INSULIN ASPART 100 UNIT/ML IJ SOLN
0.0000 [IU] | Freq: Every day | INTRAMUSCULAR | Status: DC
  Administered 2021-05-30: 5 [IU] via SUBCUTANEOUS
  Filled 2021-05-30: qty 1

## 2021-05-30 MED ORDER — INSULIN ASPART 100 UNIT/ML IJ SOLN
INTRAMUSCULAR | Status: AC
  Filled 2021-05-30: qty 1

## 2021-05-30 MED ORDER — ONDANSETRON HCL 4 MG/2ML IJ SOLN
INTRAMUSCULAR | Status: AC
  Filled 2021-05-30: qty 2

## 2021-05-30 MED ORDER — LACTATED RINGERS IV SOLN: INTRAVENOUS | Status: DC

## 2021-05-30 MED ORDER — PHENOL 1.4 % MT LIQD
1.0000 | OROMUCOSAL | Status: DC | PRN
  Filled 2021-05-30: qty 177

## 2021-05-30 MED ORDER — FLEET ENEMA 7-19 GM/118ML RE ENEM: 1.0000 | ENEMA | Freq: Once | RECTAL | Status: DC | PRN

## 2021-05-30 MED ORDER — VITAMIN D 25 MCG (1000 UNIT) PO TABS
1000.0000 [IU] | ORAL_TABLET | Freq: Every day | ORAL | Status: DC
Start: 2021-05-30 — End: 2021-05-31
  Administered 2021-05-31: 1000 [IU] via ORAL
  Filled 2021-05-30: qty 1

## 2021-05-30 MED ORDER — ACETAMINOPHEN 10 MG/ML IV SOLN
INTRAVENOUS | Status: DC | PRN
Start: 2021-05-30 — End: 2021-05-30
  Administered 2021-05-30: 1000 mg via INTRAVENOUS

## 2021-05-30 MED ORDER — TRAMADOL HCL 50 MG PO TABS
50.0000 mg | ORAL_TABLET | ORAL | Status: DC | PRN
  Administered 2021-05-30 – 2021-05-31 (×3): 100 mg via ORAL
  Filled 2021-05-30 (×3): qty 2

## 2021-05-30 MED ORDER — FENTANYL CITRATE (PF) 100 MCG/2ML IJ SOLN
INTRAMUSCULAR | Status: DC | PRN
  Administered 2021-05-30: 25 ug via INTRAVENOUS

## 2021-05-30 MED ORDER — CEFAZOLIN SODIUM-DEXTROSE 2-4 GM/100ML-% IV SOLN: 2.0000 g | INTRAVENOUS | Status: DC

## 2021-05-30 MED ORDER — SODIUM CHLORIDE FLUSH 0.9 % IV SOLN
INTRAVENOUS | Status: AC
  Filled 2021-05-30: qty 40

## 2021-05-30 SURGICAL SUPPLY — 78 items
ATTUNE PSFEM RTSZ5 NARCEM KNEE (Femur) ×1 IMPLANT
ATTUNE PSRP INSR SZ5 5 KNEE (Insert) ×1 IMPLANT
BASE TIBIAL ROT PLAT SZ 5 KNEE (Knees) IMPLANT
BATTERY INSTRU NAVIGATION (MISCELLANEOUS) ×8 IMPLANT
BLADE SAW 70X12.5 (BLADE) ×2 IMPLANT
BLADE SAW 90X13X1.19 OSCILLAT (BLADE) ×2 IMPLANT
BLADE SAW 90X25X1.19 OSCILLAT (BLADE) ×2 IMPLANT
BONE CEMENT GENTAMICIN (Cement) ×4 IMPLANT
CEMENT BONE GENTAMICIN 40 (Cement) IMPLANT
COOLER POLAR GLACIER W/PUMP (MISCELLANEOUS) ×2 IMPLANT
CUFF TOURN SGL QUICK 24 (TOURNIQUET CUFF)
CUFF TOURN SGL QUICK 34 (TOURNIQUET CUFF)
CUFF TRNQT CYL 24X4X16.5-23 (TOURNIQUET CUFF) IMPLANT
CUFF TRNQT CYL 34X4.125X (TOURNIQUET CUFF) IMPLANT
DRAPE 3/4 80X56 (DRAPES) ×2 IMPLANT
DRAPE INCISE IOBAN 66X45 STRL (DRAPES) ×2 IMPLANT
DRSG DERMACEA 8X12 NADH (GAUZE/BANDAGES/DRESSINGS) ×2 IMPLANT
DRSG MEPILEX SACRM 8.7X9.8 (GAUZE/BANDAGES/DRESSINGS) ×2 IMPLANT
DRSG OPSITE POSTOP 4X14 (GAUZE/BANDAGES/DRESSINGS) ×2 IMPLANT
DRSG TEGADERM 4X4.75 (GAUZE/BANDAGES/DRESSINGS) ×2 IMPLANT
DURAPREP 26ML APPLICATOR (WOUND CARE) ×4 IMPLANT
ELECT CAUTERY BLADE 6.4 (BLADE) ×2 IMPLANT
ELECT REM PT RETURN 9FT ADLT (ELECTROSURGICAL) ×2
ELECTRODE REM PT RTRN 9FT ADLT (ELECTROSURGICAL) ×1 IMPLANT
EX-PIN ORTHOLOCK NAV 4X150 (PIN) ×4 IMPLANT
GLOVE SRG 8 PF TXTR STRL LF DI (GLOVE) ×1 IMPLANT
GLOVE SURG ENC TEXT LTX SZ7.5 (GLOVE) ×4 IMPLANT
GLOVE SURG UNDER POLY LF SZ7.5 (GLOVE) ×2 IMPLANT
GLOVE SURG UNDER POLY LF SZ8 (GLOVE) ×1
GOWN STRL REUS W/ TWL LRG LVL3 (GOWN DISPOSABLE) ×2 IMPLANT
GOWN STRL REUS W/ TWL XL LVL3 (GOWN DISPOSABLE) ×1 IMPLANT
GOWN STRL REUS W/TWL LRG LVL3 (GOWN DISPOSABLE) ×2
GOWN STRL REUS W/TWL XL LVL3 (GOWN DISPOSABLE) ×1
HEMOVAC 400CC 10FR (MISCELLANEOUS) ×2 IMPLANT
HOLDER FOLEY CATH W/STRAP (MISCELLANEOUS) ×2 IMPLANT
HOLSTER ELECTROSUGICAL PENCIL (MISCELLANEOUS) ×2 IMPLANT
IRRIGATION SURGIPHOR STRL (IV SOLUTION) ×1 IMPLANT
IV NS IRRIG 3000ML ARTHROMATIC (IV SOLUTION) ×2 IMPLANT
KIT TURNOVER KIT A (KITS) ×2 IMPLANT
KNIFE SCULPS 14X20 (INSTRUMENTS) ×2 IMPLANT
LABEL OR SOLS (LABEL) ×2 IMPLANT
MANIFOLD NEPTUNE II (INSTRUMENTS) ×4 IMPLANT
NDL SAFETY ECLIPSE 18X1.5 (NEEDLE) ×1 IMPLANT
NDL SPNL 20GX3.5 QUINCKE YW (NEEDLE) ×2 IMPLANT
NEEDLE HYPO 18GX1.5 SHARP (NEEDLE) ×1
NEEDLE SPNL 20GX3.5 QUINCKE YW (NEEDLE) ×4 IMPLANT
NS IRRIG 500ML POUR BTL (IV SOLUTION) ×2 IMPLANT
PACK TOTAL KNEE (MISCELLANEOUS) ×2 IMPLANT
PAD ABD DERMACEA PRESS 5X9 (GAUZE/BANDAGES/DRESSINGS) ×4 IMPLANT
PAD WRAPON POLAR KNEE (MISCELLANEOUS) ×1 IMPLANT
PATELLA MEDIAL ATTUN 35MM KNEE (Knees) ×1 IMPLANT
PENCIL SMOKE EVACUATOR COATED (MISCELLANEOUS) ×2 IMPLANT
PIN DRILL FIX HALF THREAD (BIT) ×4 IMPLANT
PIN DRILL QUICK PACK ×4 IMPLANT
PIN FIXATION 1/8DIA X 3INL (PIN) ×2 IMPLANT
PULSAVAC PLUS IRRIG FAN TIP (DISPOSABLE) ×2
SOL PREP PVP 2OZ (MISCELLANEOUS) ×2
SOLUTION PREP PVP 2OZ (MISCELLANEOUS) ×1 IMPLANT
SPONGE DRAIN TRACH 4X4 STRL 2S (GAUZE/BANDAGES/DRESSINGS) ×2 IMPLANT
SPONGE T-LAP 18X18 ~~LOC~~+RFID (SPONGE) ×6 IMPLANT
STAPLER SKIN PROX 35W (STAPLE) ×2 IMPLANT
STOCKINETTE IMPERV 14X48 (MISCELLANEOUS) IMPLANT
STRAP TIBIA SHORT (MISCELLANEOUS) ×2 IMPLANT
SUCTION FRAZIER HANDLE 10FR (MISCELLANEOUS) ×1
SUCTION TUBE FRAZIER 10FR DISP (MISCELLANEOUS) ×1 IMPLANT
SUT VIC AB 0 CT1 36 (SUTURE) ×4 IMPLANT
SUT VIC AB 1 CT1 36 (SUTURE) ×4 IMPLANT
SUT VIC AB 2-0 CT2 27 (SUTURE) ×2 IMPLANT
SYR 20ML LL LF (SYRINGE) ×2 IMPLANT
SYR 30ML LL (SYRINGE) ×4 IMPLANT
TIBIAL BASE ROT PLAT SZ 5 KNEE (Knees) ×2 IMPLANT
TIP FAN IRRIG PULSAVAC PLUS (DISPOSABLE) ×1 IMPLANT
TOWEL OR 17X26 4PK STRL BLUE (TOWEL DISPOSABLE) ×2 IMPLANT
TOWER CARTRIDGE SMART MIX (DISPOSABLE) ×2 IMPLANT
TRAY FOLEY MTR SLVR 16FR STAT (SET/KITS/TRAYS/PACK) ×2 IMPLANT
WATER STERILE IRR 1000ML POUR (IV SOLUTION) ×1 IMPLANT
WATER STERILE IRR 500ML POUR (IV SOLUTION) ×1 IMPLANT
WRAPON POLAR PAD KNEE (MISCELLANEOUS) ×2

## 2021-05-30 NOTE — H&P (Signed)
The patient has been re-examined, and the chart reviewed, and there have been no interval changes to the documented history and physical.    The risks, benefits, and alternatives have been discussed at length. The patient expressed understanding of the risks benefits and agreed with plans for surgical intervention.  Brylin Stopper P. Tamanika Heiney, Jr. M.D.    

## 2021-05-30 NOTE — Transfer of Care (Signed)
Immediate Anesthesia Transfer of Care Note  Patient: Teresa Levy  Procedure(s) Performed: COMPUTER ASSISTED TOTAL KNEE ARTHROPLASTY (Right: Knee)  Patient Location: PACU  Anesthesia Type:Spinal  Level of Consciousness: awake, alert  and oriented  Airway & Oxygen Therapy: Patient Spontanous Breathing and Patient connected to nasal cannula oxygen  Post-op Assessment: Report given to RN and Post -op Vital signs reviewed and stable  Post vital signs: stable  Last Vitals:  Vitals Value Taken Time  BP 131/66 05/30/21 1215  Temp    Pulse 81 05/30/21 1216  Resp 19 05/30/21 1216  SpO2 98 % 05/30/21 1216  Vitals shown include unvalidated device data.  Last Pain:  Vitals:   05/30/21 0756  TempSrc: Oral  PainSc: 0-No pain         Complications: No notable events documented.

## 2021-05-30 NOTE — Care Management (Signed)
Patient is set up with [facility] after discharge.

## 2021-05-30 NOTE — Anesthesia Postprocedure Evaluation (Signed)
Anesthesia Post Note  Patient: [name]  Procedure(s) Performed: COMPUTER ASSISTED TOTAL KNEE ARTHROPLASTY (Right: Knee)  Patient location during evaluation: PACU Anesthesia Type: Spinal and General Level of consciousness: awake and alert, oriented and patient cooperative Pain management: pain level controlled Vital Signs Assessment: post-procedure vital signs reviewed and stable Respiratory status: spontaneous breathing, nonlabored ventilation and respiratory function stable Cardiovascular status: blood pressure returned to baseline and stable Postop Assessment: adequate PO intake Anesthetic complications: no   No notable events documented.   Last Vitals:  Vitals:   05/30/21 1359 05/30/21 1417  BP:  125/79  Pulse: 77 75  Resp: 20 14  Temp:  (!) 36.4 C  SpO2: 96% 96%    Last Pain:  Vitals:   05/30/21 1345  TempSrc:   PainSc: 0-No pain                 Darrin Nipper

## 2021-05-30 NOTE — Anesthesia Procedure Notes (Signed)
Spinal  Patient location during procedure: OR Start time: 05/30/2021 8:20 AM End time: 05/30/2021 8:29 AM Reason for block: surgical anesthesia Staffing Performed: resident/CRNA  Resident/CRNA: Natasha Mead, CRNA Preanesthetic Checklist Completed: patient identified, IV checked, site marked, risks and benefits discussed, surgical consent, monitors and equipment checked, pre-op evaluation and timeout performed Spinal Block Patient position: sitting Prep: DuraPrep Patient monitoring: heart rate, cardiac monitor, continuous pulse ox and blood pressure Approach: midline Location: L3-4 Injection technique: single-shot Needle Needle type: Pencan  Needle gauge: 24 G Needle length: 9 cm Assessment Sensory level: T6 Events: CSF return

## 2021-05-30 NOTE — Progress Notes (Signed)
ORTHOPAEDICS PROGRESS NOTE-POSTOP CHECK  PATIENT NAME: Teresa Levy DOB: 08/24/1945  MRN: [id number]  Subjective: The patient was up in a chair.  She stated the pain was under good control.  She was extremely pleased with her progress with physical therapy this afternoon.  Objective: Vital signs in last 24 hours: Temp:  [97 F (36.1 C)-97.5 F (36.4 C)] 97.5 F (36.4 C) (12/07 1417) Pulse Rate:  [72-80] 75 (12/07 1417) Resp:  [12-20] 14 (12/07 1417) BP: (125-144)/(60-80) 125/79 (12/07 1417) SpO2:  [93 %-98 %] 96 % (12/07 1417) Weight:  [91.5 kg] 91.5 kg (12/07 0756)  EXAM General: Well-developed well-nourished female seen in no apparent discomfort. Right lower extremity: Polar Care and Hemovac intact and functioning.  Homans test is negative. Neurologic: Awake, alert, and oriented.  Sensory and motor function are intact.  Assessment: Right total knee arthroplasty  Secondary diagnoses: Cardiomyopathy Congestive heart failure Coronary artery disease Hyperlipidemia Hypertension Left bundle branch block Osteopenia Type 2 diabetes History of breast cancer  Plan: Notes from physical therapy were reviewed. Goals were reviewed with the patient.  Continue with physical therapy and Occupational Therapy as per total knee arthroplasty rehab protocol.  She may be weightbearing as tolerated to the right lower extremity. Plan is to go Home after hospital stay. DVT Prophylaxis - Lovenox, Foot Pumps, and TED hose  Teresa Levy P. Holley Bouche M.D.

## 2021-05-30 NOTE — Anesthesia Preprocedure Evaluation (Addendum)
Anesthesia Evaluation  Patient identified by MRN, date of birth, ID band Patient awake    Reviewed: Allergy & Precautions, NPO status , Patient's Chart, lab work & pertinent test results  History of Anesthesia Complications Negative for: history of anesthetic complications  Airway Mallampati: III   Neck ROM: Full    Dental   Missing few molars:   Pulmonary neg pulmonary ROS,    Pulmonary exam normal breath sounds clear to auscultation       Cardiovascular hypertension, + CAD and +CHF  Normal cardiovascular exam+ pacemaker + Cardiac Defibrillator (cardiomyopathy)  Rhythm:Regular Rate:Normal  ECG 05/16/21:  Atrial-sensed ventricular-paced rhythm Abnormal ECG  TTE 02/15/19: 1. LVEF 55% 2. Normal left ventricular systolic function with mild LVH 3. Normal right ventricular systolic function 4. Mild MR and TR 5. No AR or PR 6. No valvular stenosis 7. No pericardial effusion   Neuro/Psych PSYCHIATRIC DISORDERS Anxiety negative neurological ROS     GI/Hepatic negative GI ROS, (+) Hepatitis -, A  Endo/Other  diabetesObesity   Renal/GU negative Renal ROS     Musculoskeletal  (+) Arthritis ,   Abdominal   Peds  Hematology Breast CA   Anesthesia Other Findings Reviewed 02/22/21 cardiology note.  Reviewed and agree with Bayard Males pre-anesthesia clinical review note.  Reproductive/Obstetrics                            Anesthesia Physical Anesthesia Plan  ASA: 3  Anesthesia Plan: Spinal and General   Post-op Pain Management:    Induction: Intravenous  PONV Risk Score and Plan: 3 and Propofol infusion, TIVA, Treatment may vary due to age or medical condition and Ondansetron  Airway Management Planned: Natural Airway and Nasal Cannula  Additional Equipment:   Intra-op Plan:   Post-operative Plan:   Informed Consent: I have reviewed the patients History and Physical, chart, labs and  discussed the procedure including the risks, benefits and alternatives for the proposed anesthesia with the patient or authorized representative who has indicated his/her understanding and acceptance.     Dental advisory given  Plan Discussed with: CRNA  Anesthesia Plan Comments: (Patient reports no bleeding problems and no anticoagulant use.  Plan for spinal with backup LMA/GETA.  Patient consented for risks of anesthesia including but not limited to:  - adverse reactions to medications - damage to eyes, teeth, lips or other oral mucosa - nerve damage due to positioning  - risk of bleeding, infection and or nerve damage from spinal that could lead to paralysis - risk of headache or failed spinal - damage to teeth, lips or other oral mucosa - sore throat or hoarseness - damage to heart, brain, nerves, lungs, other parts of body or loss of life  Explained role of CRNA in peri- and intra-operative care.  Patient voiced understanding.)        Anesthesia Quick Evaluation

## 2021-05-30 NOTE — Evaluation (Addendum)
Physical Therapy Evaluation Patient Details Name: Teresa Levy MRN: [id number] DOB: [birth year] Today's Date: 05/30/2021  History of Present Illness  Pt is a 75 y.o. female PMH includes: CAD, CHF, cardiomyopathy (s/p AICD placement), LBBB, cardiac murmur, HTN, HLD, T2DM, OA, LEFT breast cancer (s/p lumpectomy + XRT). Presents with a pre op dx of primary OA of R knee and is s/p R TKA.   Clinical Impression  Pt was pleasant and motivated to participate during the session and put forth good effort throughout. Pt was able to complete all bed exercises with good activation of quad musculature and little to no assistance needed from PT. Pt was able to complete bed mobility with supervision complete STS with min guard for safety. Pt did not report any adverse symptoms with positional changes. Pt was able to ambulate 176ft with no rest breaks and min guard for safety. Pt demonstrated a consistent cadence with safe use of RW. Pt education was provided about optimal limb positioning and HEP while in the hospital. Pt will benefit from Orin upon discharge to safely address deficits listed in patient problem list for decreased caregiver assistance and eventual return to PLOF.   Recommendations for follow up therapy are one component of a multi-disciplinary discharge planning process, led by the attending physician.  Recommendations may be updated based on patient status, additional functional criteria and insurance authorization.  Follow Up Recommendations Home health PT    Assistance Recommended at Discharge Frequent or constant Supervision/Assistance  Functional Status Assessment Patient has had a recent decline in their functional status and demonstrates the ability to make significant improvements in function in a reasonable and predictable amount of time.  Equipment Recommendations  Rolling walker (2 wheels)    Recommendations for Other Services       Precautions / Restrictions  Precautions Precautions: Knee;Fall Precaution Booklet Issued: Yes (comment) Restrictions Weight Bearing Restrictions: Yes RLE Weight Bearing: Weight bearing as tolerated      Mobility  Bed Mobility Overal bed mobility: Needs Assistance Bed Mobility: Supine to Sit     Supine to sit: Supervision     General bed mobility comments: Did not need help with moving RLE, min use of bedrails    Transfers Overall transfer level: Needs assistance Equipment used: Rolling walker (2 wheels) Transfers: Sit to/from Stand Sit to Stand: Min guard           General transfer comment: CGA for safety    Ambulation/Gait   Gait Distance (Feet): 100 Feet Assistive device: Rolling walker (2 wheels) Gait Pattern/deviations: Step-through pattern;Decreased step length - right;Decreased step length - left;Decreased stride length Gait velocity: decreased     General Gait Details: CGA for safety, demonstrated consistent cadence, with min cuing to attain consistent gait pattern   Stairs            Wheelchair Mobility    Modified Rankin (Stroke Patients Only)       Balance Overall balance assessment: Needs assistance Sitting-balance support: Bilateral upper extremity supported;Feet supported Sitting balance-Leahy Scale: Good     Standing balance support: Bilateral upper extremity supported;During functional activity;Reliant on assistive device for balance Standing balance-Leahy Scale: Good                               Pertinent Vitals/Pain Pain Assessment: 0-10 Pain Score: 2  Pain Location: R knee Pain Descriptors / Indicators: Aching;Discomfort Pain Intervention(s): Repositioned;Ice applied    Home Living Family/patient expects  to be discharged to:: Private residence Living Arrangements: Spouse/significant other (Husband) Available Help at Discharge: Family (Husband) Type of Home: House (Townhome) Home Access: Level entry       Home Layout: One  [place]: Rollator (4 wheels);Cane - single point;Shower seat - built in;Hand held shower head      Prior Function Prior Level of Function : Independent/Modified Independent             Mobility Comments: Pt reports being independent with mobility, used SPC, community distance ambulator ADLs Comments: Pt reports being independent with ADLs     Hand Dominance        Extremity/Trunk Assessment   Upper Extremity Assessment Upper Extremity Assessment: Overall WFL for tasks assessed    Lower Extremity Assessment Lower Extremity Assessment: Generalized weakness;RLE deficits/detail RLE Deficits / Details: s/p R TKA       Communication   Communication: No difficulties  Cognition Arousal/Alertness: Awake/alert Behavior During Therapy: WFL for tasks assessed/performed Overall Cognitive Status: Within Functional Limits for tasks assessed                                          General Comments      Exercises Total Joint Exercises Ankle Circles/Pumps: AROM;Both;10 reps;Supine Quad Sets: AROM;Right;10 reps;Supine Hip ABduction/ADduction: AROM;Right;10 reps;Supine Straight Leg Raises: AROM;Right;10 reps;Supine Goniometric ROM: R knee AROM 65 deg Other Exercises Other Exercises: Pt education on importance of optimal limb positioning while in recliner or bed Other Exercises: Pt education on HEP 10-20x every hour Other Exercises: Pt education on AD use   Assessment/Plan    PT Assessment Patient needs continued PT services  PT Problem List Decreased strength;Decreased range of motion;Decreased activity tolerance;Decreased balance;Decreased mobility;Decreased knowledge of use of DME;Decreased knowledge of precautions;Pain       PT Treatment Interventions DME instruction;Gait training;Stair training;Functional mobility training;Therapeutic activities;Therapeutic exercise;Balance training;Patient/family education    PT Goals (Current goals can  be found in the Care Plan section)  Acute Rehab PT Goals Patient Stated Goal: get back to community activity PT Goal Formulation: With patient Time For Goal Achievement: 06/12/21 Potential to Achieve Goals: Good    Frequency BID   Barriers to discharge        Co-evaluation               AM-PAC PT "6 Clicks" Mobility  Outcome Measure Help needed turning from your back to your side while in a flat bed without using bedrails?: A Little Help needed moving from lying on your back to sitting on the side of a flat bed without using bedrails?: A Little Help needed moving to and from a bed to a chair (including a wheelchair)?: A Little Help needed standing up from a chair using your arms (e.g., wheelchair or bedside chair)?: None Help needed to walk in hospital room?: A Little Help needed climbing 3-5 steps with a railing? : A Little 6 Click Score: 19    End of Session Equipment Utilized During Treatment: Gait belt Activity Tolerance: Patient tolerated treatment well Patient left: in chair;with call bell/phone within reach;with chair alarm set;with family/visitor present;with SCD's reapplied;Other (comment) (Ice reapplied, husband in room) Nurse Communication: Mobility status PT Visit Diagnosis: Other abnormalities of gait and mobility (R26.89);Muscle weakness (generalized) (M62.81);Pain;Difficulty in walking, not elsewhere classified (R26.2) Pain - Right/Left: Right Pain - part of body: Knee    Time: 0973-5329 PT Time  Calculation (min) (ACUTE ONLY): 45 min   Charges:              Sheldon Silvan SPT 05/30/21, 4:51 PM  During this treatment session, the therapist was present, participating in and directing the treatment.    Wayne Both, DPT

## 2021-05-30 NOTE — Op Note (Addendum)
OPERATIVE NOTE  DATE OF SURGERY:  05/30/2021  PATIENT NAME:  Teresa Levy   DOB: [birth year]  MRN: [id number]  PRE-OPERATIVE DIAGNOSIS: Degenerative arthrosis of the right knee, primary  POST-OPERATIVE DIAGNOSIS:  Same  PROCEDURE:  Right total knee arthroplasty using computer-assisted navigation  SURGEON:  Marciano Sequin. M.D.  ASSISTANT: Cassell Smiles, PA-C (present and scrubbed throughout the case, critical for assistance with exposure, retraction, instrumentation, and closure)  ANESTHESIA: spinal  ESTIMATED BLOOD LOSS: 50 mL  FLUIDS REPLACED: 1200 mL of crystalloid  TOURNIQUET TIME: 92 minutes  DRAINS: 2 medium Hemovac drains  SOFT TISSUE RELEASES: Anterior cruciate ligament, posterior cruciate ligament, deep medial collateral ligament, patellofemoral ligament  IMPLANTS UTILIZED: DePuy Attune size 5N posterior stabilized femoral component (cemented), size 5 rotating platform tibial component (cemented), 35 mm medialized dome patella (cemented), and a 5 mm stabilized rotating platform polyethylene insert.  INDICATIONS FOR SURGERY: Teresa Levy is a 75 y.o. year old female with a long history of progressive knee pain. X-rays demonstrated severe degenerative changes in tricompartmental fashion. The patient had not seen any significant improvement despite conservative nonsurgical intervention. After discussion of the risks and benefits of surgical intervention, the patient expressed understanding of the risks benefits and agree with plans for total knee arthroplasty.   The risks, benefits, and alternatives were discussed at length including but not limited to the risks of infection, bleeding, nerve injury, stiffness, blood clots, the need for revision surgery, cardiopulmonary complications, among others, and they were willing to proceed.  PROCEDURE IN DETAIL: The patient was brought into the operating room and, after adequate spinal anesthesia was achieved, a  tourniquet was placed on the patient's upper thigh. The patient's knee and leg were cleaned and prepped with alcohol and DuraPrep and draped in the usual sterile fashion. A "timeout" was performed as per usual protocol. The lower extremity was exsanguinated using an Esmarch, and the tourniquet was inflated to 300 mmHg. An anterior longitudinal incision was made followed by a standard mid vastus approach. The deep fibers of the medial collateral ligament were elevated in a subperiosteal fashion off of the medial flare of the tibia so as to maintain a continuous soft tissue sleeve. The patella was subluxed laterally and the patellofemoral ligament was incised. Inspection of the knee demonstrated severe degenerative changes with full-thickness loss of articular cartilage. Osteophytes were debrided using a rongeur. Anterior and posterior cruciate ligaments were excised. Two 4.0 mm Schanz pins were inserted in the femur and into the tibia for attachment of the array of trackers used for computer-assisted navigation. Hip center was identified using a circumduction technique. Distal landmarks were mapped using the computer. The distal femur and proximal tibia were mapped using the computer. The distal femoral cutting guide was positioned using computer-assisted navigation so as to achieve a 5 distal valgus cut. The femur was sized and it was felt that a size 5N femoral component was appropriate. A size 5 femoral cutting guide was positioned and the anterior cut was performed and verified using the computer. This was followed by completion of the posterior and chamfer cuts. Femoral cutting guide for the central box was then positioned in the center box cut was performed.  Attention was then directed to the proximal tibia. Medial and lateral menisci were excised. The extramedullary tibial cutting guide was positioned using computer-assisted navigation so as to achieve a 0 varus-valgus alignment and 3 posterior slope.  The cut was performed and verified using the computer. The proximal tibia was  sized and it was felt that a size 5 tibial tray was appropriate. Tibial and femoral trials were inserted followed by insertion of a 5 mm polyethylene insert. This allowed for excellent mediolateral soft tissue balancing both in flexion and in full extension. Finally, the patella was cut and prepared so as to accommodate a 35 mm medialized dome patella. A patella trial was placed and the knee was placed through a range of motion with excellent patellar tracking appreciated. The femoral trial was removed after debridement of posterior osteophytes. The central post-hole for the tibial component was reamed followed by insertion of a keel punch. Tibial trials were then removed. Cut surfaces of bone were irrigated with copious amounts of normal saline using pulsatile lavage and then suctioned dry. Polymethylmethacrylate cement with gentamicin was prepared in the usual fashion using a vacuum mixer. Cement was applied to the cut surface of the proximal tibia as well as along the undersurface of a size 5 rotating platform tibial component. Tibial component was positioned and impacted into place. Excess cement was removed using [employer] fast streamer. Cement was then applied to the cut surfaces of the femur as well as along the posterior flanges of the size 5N femoral component. The femoral component was positioned and impacted into place. Excess cement was removed using [employer] fast streamer. A 5 mm polyethylene trial was inserted and the knee was brought into full extension with steady axial compression applied. Finally, cement was applied to the backside of a 35 mm medialized dome patella and the patellar component was positioned and patellar clamp applied. Excess cement was removed using [employer] fast streamer. After adequate curing of the cement, the tourniquet was deflated after a total tourniquet time of 92 minutes. Hemostasis was achieved using electrocautery.  The knee was irrigated with copious amounts of normal saline using pulsatile lavage followed by 350 mL of Prontosan and then suctioned dry. 20 mL of 1.3% Exparel and 60 mL of 0.25% Marcaine in 40 mL of normal saline was injected along the posterior capsule, medial and lateral gutters, and along the arthrotomy site. A 5 mm stabilized rotating platform polyethylene insert was inserted and the knee was placed through a range of motion with excellent mediolateral soft tissue balancing appreciated and excellent patellar tracking noted. 2 medium drains were placed in the wound bed and brought out through separate stab incisions. The medial parapatellar portion of the incision was reapproximated using interrupted sutures of #1 Vicryl. Subcutaneous tissue was approximated in layers using first #0 Vicryl followed #2-0 Vicryl. The skin was approximated with skin staples. A sterile dressing was applied.  The patient tolerated the procedure well and was transported to the recovery room in stable condition.    Sherlynn Tourville P. Holley Bouche., M.D.

## 2021-05-31 DIAGNOSIS — M1711 Unilateral primary osteoarthritis, right knee: Secondary | ICD-10-CM | POA: Diagnosis not present

## 2021-05-31 LAB — GLUCOSE, CAPILLARY
Glucose-Capillary: 202 mg/dL — ABNORMAL HIGH (ref 70–99)
Glucose-Capillary: 214 mg/dL — ABNORMAL HIGH (ref 70–99)

## 2021-05-31 MED ORDER — OXYCODONE HCL 5 MG PO TABS: 5.0000 mg | ORAL_TABLET | ORAL | 0 refills | Status: DC | PRN

## 2021-05-31 MED ORDER — CELECOXIB 200 MG PO CAPS: 200.0000 mg | ORAL_CAPSULE | Freq: Two times a day (BID) | ORAL | 0 refills | Status: DC

## 2021-05-31 MED ORDER — ENOXAPARIN SODIUM 40 MG/0.4ML IJ SOSY: 40.0000 mg | PREFILLED_SYRINGE | INTRAMUSCULAR | 0 refills | Status: DC

## 2021-05-31 MED ORDER — TRAMADOL HCL 50 MG PO TABS: 50.0000 mg | ORAL_TABLET | ORAL | 0 refills | Status: DC | PRN

## 2021-05-31 NOTE — Discharge Summary (Signed)
Physician Discharge Summary  Patient ID: Teresa Levy MRN: [id number] DOB/AGE: Oct 10, 1945 75 y.o.  Admit date: 05/30/2021 Discharge date: 05/31/2021  Admission Diagnoses:  Total knee replacement status [Z96.659]  Surgeries:Procedure(s):  Right total knee arthroplasty using computer-assisted navigation   SURGEON:  Marciano Sequin. M.D.   ASSISTANT: Cassell Smiles, PA-C (present and scrubbed throughout the case, critical for assistance with exposure, retraction, instrumentation, and closure)   ANESTHESIA: spinal   ESTIMATED BLOOD LOSS: 50 mL   FLUIDS REPLACED: 1200 mL of crystalloid   TOURNIQUET TIME: 92 minutes   DRAINS: 2 medium Hemovac drains   SOFT TISSUE RELEASES: Anterior cruciate ligament, posterior cruciate ligament, deep medial collateral ligament, patellofemoral ligament   IMPLANTS UTILIZED: DePuy Attune size 5N posterior stabilized femoral component (cemented), size 5 rotating platform tibial component (cemented), 35 mm medialized dome patella (cemented), and a 5 mm stabilized rotating platform polyethylene insert.  Discharge Diagnoses: Patient Active Problem List   Diagnosis Date Noted   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   Severe obesity (BMI 35.0-39.9) with comorbidity ([place]) 03/13/2021   Osteopenia 03/26/2019   Anxiety 07/22/2018   Primary osteoarthritis of right knee 09/23/2017   HX: breast cancer 01/15/2017   Obesity (BMI 30-39.9) 59/93/5701   Chronic systolic CHF (congestive heart failure), NYHA class 2 ([place]) 06/07/2015   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   Type 2 diabetes mellitus without complication ([place]) 77/93/9030   Essential (primary) hypertension 11/24/2014   History of prolonged Q-T interval on ECG 11/24/2014   Macular hole 11/24/2014   TI (tricuspid incompetence) 08/16/2014   Breathlessness on exertion 08/16/2014   Bradycardia 08/06/2014   Combined fat and carbohydrate induced  hyperlipemia 02/08/2014    Past Medical History:  Diagnosis Date   Arthritis    Biventricular ICD (implantable cardioverter-defibrillator) in place 02/23/2009   Breast cancer, left ([place]) 2004   a.) s/p lumpectomy + XRT   Cardiomyopathy ([place]) 2009   a.) TTE 2009: EF 20%. b.) TTE 12/2008: EF 35%. c.) BiV ICD implanted. d.) TTE 08/16/2014: EF 55-60%. e.) TTE 02/15/2019: EF 55%.   CHF (congestive heart failure) ([place])    a.) TTE 08/16/2014: EF 55-60%; norm LV function; mod LA enlargement; triv PR; mod MR/TR. b.) TTE 02/15/2019: EF 55%; norm LV function with mild LVH; mild MR/TR.   Coronary artery disease    Heart murmur    Hemorrhoids    Hepatitis A 1981   History of 2019 novel coronavirus disease (COVID-19) 07/20/2019   Hyperlipemia    Hypertension    Left bundle branch block    Macular degeneration    Osteopenia    Personal history of radiation therapy 2004   a.) XRT to LEFT breast   T2DM (type 2 diabetes mellitus) ([place])      Transfusion:    Consultants (if any):   Discharged Condition: Improved  Hospital Course: Teresa Levy is an 75 y.o. female who was admitted 05/30/2021 with a diagnosis of right knee osteoarthritis and went to the operating room on 05/30/2021 and underwent right total knee arthroplasty. The patient received perioperative antibiotics for prophylaxis (see below). The patient tolerated the procedure well and was transported to PACU in stable condition. After meeting PACU criteria, the patient was subsequently transferred to the Orthopaedics/Rehabilitation unit.   The patient received DVT prophylaxis in the form of early mobilization, Lovenox, Foot Pumps, and TED hose. A sacral pad had been placed and heels were elevated off of the bed with rolled towels in order  to protect skin integrity. Foley catheter was discontinued on postoperative day #0. Wound drains were discontinued on postoperative day #1. The surgical incision was healing well without signs of  infection.  Physical therapy was initiated postoperatively for transfers, gait training, and strengthening. Occupational therapy was initiated for activities of daily living and evaluation for assisted devices. Rehabilitation goals were reviewed in detail with the patient. The patient made steady progress with physical therapy and physical therapy recommended discharge to Home.   The patient achieved the preliminary goals of this hospitalization and was felt to be medically and orthopaedically appropriate for discharge.  She was given perioperative antibiotics:  Anti-infectives (From admission, onward)    Start     Dose/Rate Route Frequency Ordered Stop   05/30/21 1500  ceFAZolin (ANCEF) IVPB 2g/100 mL premix        2 g 200 mL/hr over 30 Minutes Intravenous Every 6 hours 05/30/21 1409 05/30/21 2246   05/30/21 0740  ceFAZolin (ANCEF) 2-4 GM/100ML-% IVPB       Note to Pharmacy: Doreen Salvage J: cabinet override      05/30/21 0740 05/30/21 1944   05/30/21 0600  ceFAZolin (ANCEF) IVPB 2g/100 mL premix  Status:  Discontinued        2 g 200 mL/hr over 30 Minutes Intravenous On call to O.R. 05/30/21 1962 05/30/21 0735     .  Recent vital signs:  Vitals:   05/31/21 0735 05/31/21 1128  BP: (!) 132/59 (!) 122/57  Pulse: 72 68  Resp: 16 18  Temp: 98.1 F (36.7 C) 97.9 F (36.6 C)  SpO2: 97% 100%    Recent laboratory studies:  No results for input(s): WBC, HGB, HCT, PLT, K, CL, CO2, BUN, CREATININE, GLUCOSE, CALCIUM, LABPT, INR in the last 72 hours.  Diagnostic Studies: DG Knee Right Port  Result Date: 05/30/2021 CLINICAL DATA:  75 year old female status post right total knee arthroplasty. EXAM: PORTABLE RIGHT KNEE - 1-2 VIEW COMPARISON:  None. FINDINGS: Immediate postsurgical changes after right total knee arthroplasty. The femoral and tibial components are well seated without evidence of hardware loosening, fracture, or malalignment. Small joint effusion. Surgical drain in place  anterior to the distal femur. Scattered subcutaneous emphysema. IMPRESSION: Immediate postsurgical changes after right total knee arthroplasty without complicating features. Electronically Signed   By: Ruthann Cancer M.D.   On: 05/30/2021 12:34    Discharge Medications:   Allergies as of 05/31/2021       Reactions   Bacitracin Swelling   When used as an eye ointment   Tape Rash        Medication List     STOP taking these medications    aspirin EC 81 MG tablet       TAKE these medications    acetaminophen 500 MG tablet Commonly known as: TYLENOL Take 1,000 mg by mouth every 6 (six) hours as needed for moderate pain.   atorvastatin 20 MG tablet Commonly known as: LIPITOR Take 20 mg by mouth at bedtime.   Calcium Carbonate-Vitamin D 600-10 MG-MCG Tabs Take 1 tablet by mouth daily.   carvedilol 12.5 MG tablet Commonly known as: COREG Take 12.5 mg by mouth 2 (two) times daily.   celecoxib 200 MG capsule Commonly known as: CELEBREX Take 1 capsule (200 mg total) by mouth 2 (two) times daily.   diclofenac Sodium 1 % Gel Commonly known as: VOLTAREN Apply 2 g topically daily as needed (knee pain).   enoxaparin 40 MG/0.4ML injection Commonly known as: LOVENOX Inject  0.4 mLs (40 mg total) into the skin daily for 14 days.   escitalopram 20 MG tablet Commonly known as: LEXAPRO Take 20 mg by mouth daily.   glimepiride 4 MG tablet Commonly known as: AMARYL Take 4 mg by mouth in the morning and at bedtime.   Glucosamine Chondr 1500 Complx Caps Take 1 capsule by mouth daily.   lisinopril-hydrochlorothiazide 20-12.5 MG tablet Commonly known as: ZESTORETIC Take 2 tablets by mouth daily.   metFORMIN 750 MG 24 hr tablet Commonly known as: GLUCOPHAGE-XR Take 750 mg by mouth at bedtime.   multivitamin capsule Take 1 capsule by mouth daily.   oxyCODONE 5 MG immediate release tablet Commonly known as: Oxy IR/ROXICODONE Take 1 tablet (5 mg total) by mouth every 4  (four) hours as needed for severe pain (pain score 4-6).   traMADol 50 MG tablet Commonly known as: ULTRAM Take 1 tablet (50 mg total) by mouth every 4 (four) hours as needed for moderate pain.   Trulicity 0.73 XT/0.6YI Sopn Generic drug: Dulaglutide Inject 0.75 mg into the skin every 7 (seven) days.   vitamin C 500 MG tablet Commonly known as: ASCORBIC ACID Take 500 mg by mouth daily.   Vitamin D3 25 MCG (1000 UT) Caps Take 1,000 Units by mouth daily.        Disposition: Home with home health PT     Follow-up Information     Fausto Skillern, PA-C Follow up on 06/14/2021.   Specialty: Orthopedic Surgery Why: at 9:45am Contact information: [place] [address] [phone]         Dereck Leep, MD Follow up on 07/12/2021.   Specialty: Orthopedic Surgery Why: at 2:15pm Contact information: [place] [address] [place], PA-C 05/31/2021, 1:33 PM

## 2021-05-31 NOTE — Progress Notes (Signed)
Physical Therapy Treatment Patient Details Name: Teresa Levy MRN: [id number] DOB: 05-25-1946 Today's Date: 05/31/2021   History of Present Illness Pt is a 75 y.o. female PMH includes: CAD, CHF, cardiomyopathy (s/p AICD placement), LBBB, cardiac murmur, HTN, HLD, T2DM, OA, LEFT breast cancer (s/p lumpectomy + XRT). Presents with a pre op dx of primary OA of R knee and is s/p R TKA.   PT Comments    Pt was pleasant and motivated to participate during the session and put forth good effort throughout. Pt was able to complete all exercises in bed with slight resistance. R knee AROM showed improvement compared to yesterday. Pt was able to complete bed mobility with supervision and did not need any help with RLE control. Pt was able to STS with ease and min guard for safety. Pt ambulated a total of 324ft with no rest breaks or adverse symptoms reported with min guard for safety. Pt completed stair training with min guard for safety and demonstrated good understanding of proper and safe sequencing. She practiced 4 steps and then a single step to simulate threshold into house. SpO2 and HR remained WNL throughout the session. Pt will benefit from HHPT upon discharge to safely address deficits listed in patient problem list for decreased caregiver assistance and eventual return to PLOF.    Recommendations for follow up therapy are one component of a multi-disciplinary discharge planning process, led by the attending physician.  Recommendations may be updated based on patient status, additional functional criteria and insurance authorization.  Follow Up Recommendations  Home health PT     Assistance Recommended at Discharge Frequent or constant Supervision/Assistance  Equipment Recommendations  Rolling walker (2 wheels)    Recommendations for Other Services       Precautions / Restrictions Precautions Precautions: Knee;Fall Precaution Booklet Issued: Yes (comment) Restrictions Weight  Bearing Restrictions: Yes RLE Weight Bearing: Weight bearing as tolerated     Mobility  Bed Mobility Overal bed mobility: Needs Assistance Bed Mobility: Supine to Sit     Supine to sit: Supervision     General bed mobility comments: Did not need help with moving RLE, min use of bedrails    Transfers Overall transfer level: Needs assistance Equipment used: Rolling walker (2 wheels) Transfers: Sit to/from Stand Sit to Stand: Min guard           General transfer comment: CGA for safety    Ambulation/Gait Ambulation/Gait assistance: Min guard Gait Distance (Feet): 350 Feet Assistive device: Rolling walker (2 wheels) Gait Pattern/deviations: Step-through pattern;Decreased step length - right;Decreased step length - left;Decreased stride length Gait velocity: decreased     General Gait Details: CGA for safety, demonstrated consistent cadence   Stairs Stairs: Yes Stairs assistance: Min guard Stair Management: Two rails;Forwards Number of Stairs: 4 General stair comments: Min guard for safety, demonstrated good understanding of proper stair sequencing, due to appropriate ROM and pain level pt attemped ascending stairs leading with operative leg and appeared to have no issue, practiced 4 steps and single step   Wheelchair Mobility    Modified Rankin (Stroke Patients Only)       Balance Overall balance assessment: Needs assistance Sitting-balance support: Bilateral upper extremity supported;Feet supported Sitting balance-Leahy Scale: Normal     Standing balance support: Bilateral upper extremity supported;During functional activity Standing balance-Leahy Scale: Good                              Cognition  Arousal/Alertness: Awake/alert Behavior During Therapy: WFL for tasks assessed/performed Overall Cognitive Status: Within Functional Limits for tasks assessed                                          Exercises Total Joint  Exercises Ankle Circles/Pumps: AROM;Both;10 reps;Supine Quad Sets: AROM;Right;10 reps;Supine Short Arc Quad: AROM;Right;10 reps;Supine Heel Slides: AROM;Right;10 reps;Supine Hip ABduction/ADduction: AROM;Right;10 reps;Supine Straight Leg Raises: AROM;Right;10 reps;Supine Goniometric ROM: R knee AROM 1-89 deg Other Exercises Other Exercises: Pt education on proper stair sequencing Other Exercises: Pt education on HEP 10-20x every hour    General Comments        Pertinent Vitals/Pain Pain Assessment: 0-10 Pain Score: 0-No pain    Home Living                          Prior Function            PT Goals (current goals can now be found in the care plan section) Acute Rehab PT Goals Patient Stated Goal: get back to community activity PT Goal Formulation: With patient Time For Goal Achievement: 06/12/21 Potential to Achieve Goals: Good Progress towards PT goals: Progressing toward goals    Frequency    BID      PT Plan Current plan remains appropriate    Co-evaluation              AM-PAC PT "6 Clicks" Mobility   Outcome Measure  Help needed turning from your back to your side while in a flat bed without using bedrails?: A Little Help needed moving from lying on your back to sitting on the side of a flat bed without using bedrails?: A Little Help needed moving to and from a bed to a chair (including a wheelchair)?: A Little Help needed standing up from a chair using your arms (e.g., wheelchair or bedside chair)?: None Help needed to walk in hospital room?: A Little Help needed climbing 3-5 steps with a railing? : A Little 6 Click Score: 19    End of Session Equipment Utilized During Treatment: Gait belt Activity Tolerance: Patient tolerated treatment well Patient left: in chair;with call bell/phone within reach;with chair alarm set;with SCD's reapplied;Other (comment) (Ice reapplied) Nurse Communication: Mobility status PT Visit Diagnosis: Other  abnormalities of gait and mobility (R26.89);Muscle weakness (generalized) (M62.81);Pain;Difficulty in walking, not elsewhere classified (R26.2) Pain - Right/Left: Right Pain - part of body: Knee     Time: 1021-1173 PT Time Calculation (min) (ACUTE ONLY): 29 min  Charges:                        Sheldon Silvan SPT 05/31/21, 10:58 AM

## 2021-05-31 NOTE — Progress Notes (Signed)
Physical Therapy Treatment Patient Details Name: Teresa Levy MRN: [id number] DOB: September 21, 1945 Today's Date: 05/31/2021   History of Present Illness Pt is a 75 y.o. female PMH includes: CAD, CHF, cardiomyopathy (s/p AICD placement), LBBB, cardiac murmur, HTN, HLD, T2DM, OA, LEFT breast cancer (s/p lumpectomy + XRT). Presents with a pre op dx of primary OA of R knee and is s/p R TKA.   PT Comments    Pt was pleasant and motivated to participate during the session and put forth good effort throughout. Pt was able to complete all bed exercises with slight increase in pain. Pt appeared to be moving knee more comfortably after getting bandages removed earlier by PA. Pt was able to complete bed mobility with supervision and did not report any adverse symptoms upon sitting EOB. Pt was able to complete STS with ease and min guard for safety. Pt ambulated a total of 275ft with min guard for safety and improved cadence from AM session, and appeared to be more confident with movement. Pt reported an increase in pain following ambulation but was tolerable. SpO2 and HR remained WNL throughout the session. Pt will benefit from HHPT upon discharge to safely address deficits listed in patient problem list for decreased caregiver assistance and eventual return to PLOF.   Recommendations for follow up therapy are one component of a multi-disciplinary discharge planning process, led by the attending physician.  Recommendations may be updated based on patient status, additional functional criteria and insurance authorization.  Follow Up Recommendations  Home health PT     Assistance Recommended at Discharge Frequent or constant Supervision/Assistance  Equipment Recommendations  Rolling walker (2 wheels)    Recommendations for Other Services       Precautions / Restrictions Precautions Precautions: Knee;Fall Precaution Booklet Issued: Yes (comment) Restrictions Weight Bearing Restrictions: Yes RLE  Weight Bearing: Weight bearing as tolerated     Mobility  Bed Mobility Overal bed mobility: Needs Assistance Bed Mobility: Supine to Sit;Sit to Supine     Supine to sit: Supervision Sit to supine: Supervision   General bed mobility comments: Pt required supervision for bed mobility and did not need assistance with RLE control    Transfers Overall transfer level: Needs assistance Equipment used: Rolling walker (2 wheels) Transfers: Sit to/from Stand Sit to Stand: Min guard           General transfer comment: Min guard for safety, pt was able to complete STS with ease and no adverse symptoms reported    Ambulation/Gait Ambulation/Gait assistance: Min guard Gait Distance (Feet): 200 Feet Assistive device: Rolling walker (2 wheels) Gait Pattern/deviations: Step-through pattern;Decreased step length - right;Decreased step length - left;Decreased stride length Gait velocity: decreased     General Gait Details: CGA for safety, demonstrated consistent cadence with improvement from AM session   Stairs             Wheelchair Mobility    Modified Rankin (Stroke Patients Only)       Balance Overall balance assessment: Needs assistance Sitting-balance support: Bilateral upper extremity supported;Feet supported Sitting balance-Leahy Scale: Normal     Standing balance support: Bilateral upper extremity supported;During functional activity Standing balance-Leahy Scale: Good Standing balance comment: No LOB, min guard for safety                            Cognition Arousal/Alertness: Awake/alert Behavior During Therapy: WFL for tasks assessed/performed Overall Cognitive Status: Within Functional Limits for tasks assessed  Exercises Total Joint Exercises Ankle Circles/Pumps: AROM;Both;10 reps;Supine Quad Sets: AROM;Right;10 reps;Supine Short Arc Quad: AROM;Right;10 reps;Supine Heel Slides:  AROM;Right;10 reps;Supine Hip ABduction/ADduction: AROM;Right;10 reps;Supine Straight Leg Raises: AROM;Right;10 reps;Supine Marching in Standing: AROM;Both;10 reps;Supine Other Exercises Other Exercises: Pt education on HEP packet left in room    General Comments        Pertinent Vitals/Pain Pain Assessment: 0-10 Pain Score: 3  Pain Location: R knee Pain Descriptors / Indicators: Aching;Discomfort Pain Intervention(s): Repositioned;Ice applied    Home Living Family/patient expects to be discharged to:: Private residence Living Arrangements: Spouse/significant other (Husband) Available Help at Discharge: Family (Husband) Type of Home: House (Townhome) Home Access: Level entry       Home Layout: One level Home Equipment: Rollator (4 wheels);Cane - single point;Shower seat - built in;Hand held shower head;Adaptive equipment      Prior Function            PT Goals (current goals can now be found in the care plan section) Acute Rehab PT Goals Patient Stated Goal: get back to community activity PT Goal Formulation: With patient Time For Goal Achievement: 06/12/21 Potential to Achieve Goals: Good Progress towards PT goals: Progressing toward goals    Frequency    BID      PT Plan Current plan remains appropriate    Co-evaluation              AM-PAC PT "6 Clicks" Mobility   Outcome Measure  Help needed turning from your back to your side while in a flat bed without using bedrails?: A Little Help needed moving from lying on your back to sitting on the side of a flat bed without using bedrails?: A Little Help needed moving to and from a bed to a chair (including a wheelchair)?: A Little Help needed standing up from a chair using your arms (e.g., wheelchair or bedside chair)?: None Help needed to walk in hospital room?: A Little Help needed climbing 3-5 steps with a railing? : A Little 6 Click Score: 19    End of Session Equipment Utilized During  Treatment: Gait belt Activity Tolerance: Patient tolerated treatment well Patient left: in bed;with call bell/phone within reach;with bed alarm set;with family/visitor present;with SCD's reapplied;Other (comment) (Husband in room, ice reapplied) Nurse Communication: Mobility status PT Visit Diagnosis: Other abnormalities of gait and mobility (R26.89);Muscle weakness (generalized) (M62.81);Pain;Difficulty in walking, not elsewhere classified (R26.2) Pain - Right/Left: Right Pain - part of body: Knee     Time: 1610-9604 PT Time Calculation (min) (ACUTE ONLY): 26 min  Charges:                        Sheldon Silvan SPT 05/31/21, 3:26 PM

## 2021-05-31 NOTE — Progress Notes (Signed)
  Subjective: 1 Day Post-Op Procedure(s) (LRB): COMPUTER ASSISTED TOTAL KNEE ARTHROPLASTY (Right) Patient reports pain as well-controlled.   Patient is well, and has had no acute complaints or problems Plan is to go Home after hospital stay. Negative for chest pain and shortness of breath Fever: no Gastrointestinal: negative for nausea and vomiting.  Patient has not had a bowel movement.  Objective: Vital signs in last 24 hours: Temp:  [97 F (36.1 C)-98.2 F (36.8 C)] 98.2 F (36.8 C) (12/08 0350) Pulse Rate:  [72-80] 72 (12/08 0350) Resp:  [12-20] 16 (12/08 0350) BP: (125-167)/(60-79) 130/67 (12/08 0350) SpO2:  [93 %-98 %] 97 % (12/08 0350)  Intake/Output from previous day:  Intake/Output Summary (Last 24 hours) at 05/31/2021 0811 Last data filed at 05/31/2021 0421 Gross per 24 hour  Intake 2440 ml  Output 1287 ml  Net 1153 ml    Intake/Output this shift: No intake/output data recorded.  Labs: No results for input(s): HGB in the last 72 hours. No results for input(s): WBC, RBC, HCT, PLT in the last 72 hours. No results for input(s): NA, K, CL, CO2, BUN, CREATININE, GLUCOSE, CALCIUM in the last 72 hours. No results for input(s): LABPT, INR in the last 72 hours.   EXAM General - Patient is Alert, Appropriate, and Oriented Extremity - Neurovascular intact Dorsiflexion/Plantar flexion intact Compartment soft Dressing/Incision -Postoperative dressing remains in place., Polar Care in place and working. , Hemovac in place.  Motor Function - intact, moving foot and toes well on exam. Able to perform independent SLR.  Cardiovascular- Regular rate and rhythm, no murmurs/rubs/gallops Respiratory- Lungs clear to auscultation bilaterally Gastrointestinal- soft, nontender, and hypoactive bowel sounds   Assessment/Plan: 1 Day Post-Op Procedure(s) (LRB): COMPUTER ASSISTED TOTAL KNEE ARTHROPLASTY (Right) Principal Problem:   Total knee replacement status  Estimated body mass  index is 39.4 kg/m as calculated from the following:   Height as of this encounter: 5' (1.524 m).   Weight as of this encounter: 91.5 kg. Advance diet Up with therapy  Plan for d/c later today.     DVT Prophylaxis - Lovenox, Ted hose, and foot pumps Weight-Bearing as tolerated to left leg  Cassell Smiles, PA-C [facility] 05/31/2021, 8:11 AM

## 2021-05-31 NOTE — Progress Notes (Signed)
DISCHARGE NOTE:  Pt given discharge instructions. Pt verbalized understanding. TED hose on both legs, extra honeycomb sent and walker. Pt wheeled to car by staff. Husband providing transportation.

## 2021-05-31 NOTE — Evaluation (Signed)
Occupational Therapy Evaluation Patient Details Name: Teresa Levy MRN: [id number] DOB: [birth year] Today's Date: 05/31/2021   History of Present Illness Pt is a 75 y.o. female PMH includes: CAD, CHF, cardiomyopathy (s/p AICD placement), LBBB, cardiac murmur, HTN, HLD, T2DM, OA, LEFT breast cancer (s/p lumpectomy + XRT). Presents with a pre op dx of primary OA of R knee and is s/p R TKA.   Clinical Impression   Pt seen for OT evaluation this date, POD#1 from above surgery. Pt was active and independent in all ADLs, however, was unable to participate in meaningful activities such as walks with friends due to R knee pain. Pt is eager to return to PLOF with less pain and improved safety and independence. Pt currently requires PRN minimal assist for LB dressing and bathing while in seated position due to limited AROM of R knee (impaired somewhat by bulky dressings). Pt instructed in polar care mgt, falls prevention strategies and pet care considerations since she has a cat who tends to get underfoot, home/routines modifications, DME/AE for LB bathing and dressing tasks, and compression stocking mgt. Handout provided to support recall and carryover. Pt verbalized understanding and denied additional questions/concerns. Do not currently anticipate any OT needs following this hospitalization. All education/training provided at time of initial evaluation. Will sign off.    Recommendations for follow up therapy are one component of a multi-disciplinary discharge planning process, led by the attending physician.  Recommendations may be updated based on patient status, additional functional criteria and insurance authorization.   Follow Up Recommendations  No OT follow up    Assistance Recommended at Discharge PRN  Functional Status Assessment  Patient has had a recent decline in their functional status and demonstrates the ability to make significant improvements in function in a reasonable and  predictable amount of time.  Equipment Recommendations  None recommended by OT    Recommendations for Other Services       Precautions / Restrictions Precautions Precautions: Knee;Fall Precaution Booklet Issued: Yes (comment) Restrictions Weight Bearing Restrictions: Yes RLE Weight Bearing: Weight bearing as tolerated      Mobility Bed Mobility      General bed mobility comments: NT, up in recliner at start and end of session    Transfers Overall transfer level: Needs assistance Equipment used: Rolling walker (2 wheels) Transfers: Sit to/from Stand Sit to Stand: Min guard           General transfer comment: as observed with PT      Balance Overall balance assessment: Mild deficits observed, not formally tested                              ADL either performed or assessed with clinical judgement   ADL Overall ADL's : Needs assistance/impaired                                       General ADL Comments: Pt currently requires PRN MIN A for LB bathing and dressing RLE 2/2 decreased strength and ROM in R knee.     Vision         Perception     Praxis      Pertinent Vitals/Pain Pain Assessment: No/denies pain Pain Score: 0-No pain     Hand Dominance     Extremity/Trunk Assessment Upper Extremity Assessment Upper Extremity Assessment: Overall WFL for  tasks assessed   Lower Extremity Assessment Lower Extremity Assessment: RLE deficits/detail RLE Deficits / Details: s/p R TKA       Communication Communication Communication: No difficulties   Cognition Arousal/Alertness: Awake/alert Behavior During Therapy: WFL for tasks assessed/performed Overall Cognitive Status: Within Functional Limits for tasks assessed                                       General Comments       Exercises Other Exercises: Pt instructed in polar care mgt, pet care considerations and falls prevention, home/routines  modifications, AE/DME for ADL, RW mgt in bathroom versus kitchen, and compression stocking mgt. Handout provided.   Shoulder Instructions      Home Living Family/patient expects to be discharged to:: Private residence Living Arrangements: Spouse/significant other (Husband) Available Help at Discharge: Family (Husband) Type of Home: House (Townhome) Home Access: Level entry     Home Layout: One level     Bathroom Shower/Tub: Chief Strategy Officer: Rollator (4 wheels);Cane - single point;Shower seat - built in;Hand held shower head;Adaptive equipment Adaptive Equipment: Reacher;Long-handled shoe horn        Prior Functioning/Environment Prior Level of Function : Independent/Modified Independent             Mobility Comments: Pt reports being independent with mobility, used SPC, community distance ambulator ADLs Comments: Pt reports being independent with ADLs        OT Problem List: Decreased range of motion      OT Treatment/Interventions:      OT Goals(Current goals can be found in the care plan section) Acute Rehab OT Goals Patient Stated Goal: go home later today OT Goal Formulation: All assessment and education complete, DC therapy  OT Frequency:     Barriers to D/C:            Co-evaluation              AM-PAC OT "6 Clicks" Daily Activity     Outcome Measure Help from another person eating meals?: None Help from another person taking care of personal grooming?: None Help from another person toileting, which includes using toliet, bedpan, or urinal?: None Help from another person bathing (including washing, rinsing, drying)?: A Little Help from another person to put on and taking off regular upper body clothing?: None Help from another person to put on and taking off regular lower body clothing?: A Little 6 Click Score: 22   End of Session    Activity Tolerance: Patient tolerated treatment well Patient left: in chair;with  call bell/phone within reach;with chair alarm set;Other (comment) (polar care and hemovac in place)  OT Visit Diagnosis: Other abnormalities of gait and mobility (R26.89)                Time: 8295-6213 OT Time Calculation (min): 20 min Charges:  OT General Charges $OT Visit: 1 Visit OT Evaluation $OT Eval Low Complexity: 1 Low OT Treatments $Self Care/Home Management : 8-22 mins  Ardeth Perfect., MPH, MS, OTR/L ascom [phone] 05/31/21, 12:03 PM

## 2021-05-31 NOTE — Progress Notes (Signed)
Post-op dressing removed. , Hemovac removed., Mini compression dressing applied. , and Fresh honeycomb dressing applied.   

## 2021-05-31 NOTE — TOC Initial Note (Signed)
Transition of Care [facility]) - Initial/Assessment Note    Patient Details  Name: Teresa Levy MRN: [id number] Date of Birth: [birth year]  Transition of Care [facility]) CM/SW Contact:    Pete Pelt, RN Phone Number: 05/31/2021, 2:07 PM  Clinical Narrative:      Patient lives at home with spouse, who is able to assist her when needed.  Patient states she has not concerns with transportation to the pharmacy or appointments, and she is able to take medications as directed.  Patient states she will need a rolling walker at home, but otherwise, has not concerns about returning home on discharge.              Rolling walker ordered with [employer], to be delivered to room. Expected Discharge Plan: [place] Barriers to Discharge: Barriers Resolved   Patient Goals and CMS Choice        Expected Discharge Plan and Services Expected Discharge Plan: [place]   Discharge Planning Services: CM Consult   Living arrangements for the past 2 months: Single Family Home Expected Discharge Date: 05/31/21               DME Arranged: Gilford Rile rolling DME Agency: [employer] Date DME Agency Contacted: 05/31/21 Time DME Agency Contacted: [phone] Representative spoke with at DME Agency: [facility] HH Arranged: PT, OT [place] Agency: [place] Date [place]: 05/31/21 Time [place]: [id number] Representative spoke with at [place]: [place]  Prior Living Arrangements/Services Living arrangements for the past 2 months: [place] with:: Self, Spouse Patient language and need for interpreter reviewed:: Yes (NO interpreter required) Do you feel safe going back to the place where you live?: Yes      Need for Family Participation in Patient Care: Yes (Comment) Care giver support system in place?: Yes (comment)   Criminal Activity/Legal Involvement Pertinent to Current Situation/Hospitalization: No - Comment as needed  Activities of  Daily Living Home Assistive Devices/Equipment: Walker (specify type) ADL Screening (condition at time of admission) Patient's cognitive ability adequate to safely complete daily activities?: Yes Is the patient deaf or have difficulty hearing?: No Does the patient have difficulty seeing, even when wearing glasses/contacts?: No Does the patient have difficulty concentrating, remembering, or making decisions?: No Patient able to express need for assistance with ADLs?: Yes Does the patient have difficulty dressing or bathing?: No Independently performs ADLs?: Yes (appropriate for developmental age) Does the patient have difficulty walking or climbing stairs?: No Weakness of Legs: None Weakness of Arms/Hands: None  Permission Sought/Granted Permission sought to share information with : Case Manager Permission granted to share information with : Yes, Verbal Permission Granted     Permission granted to share info w AGENCY: [facility] and DME [employer]        Emotional Assessment Appearance:: Appears stated age Attitude/Demeanor/Rapport: Gracious, Engaged Affect (typically observed): Appropriate, Pleasant Orientation: : Oriented to Self, Oriented to Place, Oriented to  Time, Oriented to Situation Alcohol / Substance Use: Not Applicable Psych Involvement: No (comment)  Admission diagnosis:  Total knee replacement status [Z96.659] Patient Active Problem List   Diagnosis Date Noted   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   Severe obesity (BMI 35.0-39.9) with comorbidity ([place]) 03/13/2021   Osteopenia 03/26/2019   Anxiety 07/22/2018   Primary osteoarthritis of right knee 09/23/2017   HX: breast cancer 01/15/2017   Obesity (BMI 30-39.9) 04/17/8526   Chronic systolic CHF (congestive heart failure), NYHA class 2 (  [place]) 06/07/2015   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   Type 2 diabetes mellitus without complication ([place]) 75/73/2256    Essential (primary) hypertension 11/24/2014   History of prolonged Q-T interval on ECG 11/24/2014   Macular hole 11/24/2014   TI (tricuspid incompetence) 08/16/2014   Breathlessness on exertion 08/16/2014   Bradycardia 08/06/2014   Combined fat and carbohydrate induced hyperlipemia 02/08/2014   PCP:  Juluis Pitch, MD Pharmacy:   [id number] - [place], [place] [address] Phone: [phone] Fax: [phone]     Social Determinants of Health ([place]) Interventions    Readmission Risk Interventions No flowsheet data found.

## 2021-06-01 ENCOUNTER — Encounter: Payer: Self-pay | Admitting: Orthopedic Surgery

## 2021-06-01 LAB — BPAM RBC
Blood Product Expiration Date: 202212232359
Blood Product Expiration Date: 202212242359
Unit Type and Rh: 1700
Unit Type and Rh: 7300

## 2021-06-01 LAB — TYPE AND SCREEN
ABO/RH(D): B POS
Antibody Screen: POSITIVE
Unit division: 0
Unit division: 0

## 2021-06-04 ENCOUNTER — Encounter: Payer: Self-pay | Admitting: Orthopedic Surgery

## 2021-10-01 ENCOUNTER — Other Ambulatory Visit: Payer: Self-pay | Admitting: Family Medicine

## 2021-10-01 DIAGNOSIS — Z1231 Encounter for screening mammogram for malignant neoplasm of breast: Secondary | ICD-10-CM

## 2021-11-05 ENCOUNTER — Ambulatory Visit
Admission: RE | Admit: 2021-11-05 | Discharge: 2021-11-05 | Disposition: A | Discharge status: Home / Self Care | Payer: Medicare PPO | Source: Ambulatory Visit | Attending: Family Medicine | Admitting: Family Medicine

## 2021-11-05 DIAGNOSIS — Z1231 Encounter for screening mammogram for malignant neoplasm of breast: Secondary | ICD-10-CM | POA: Insufficient documentation

## 2022-10-17 ENCOUNTER — Other Ambulatory Visit: Payer: Self-pay | Admitting: Family Medicine

## 2022-10-17 DIAGNOSIS — Z1231 Encounter for screening mammogram for malignant neoplasm of breast: Secondary | ICD-10-CM

## 2023-01-08 ENCOUNTER — Ambulatory Visit
Admission: RE | Admit: 2023-01-08 | Discharge: 2023-01-08 | Disposition: A | Discharge status: Home / Self Care | Payer: Medicare PPO | Source: Ambulatory Visit | Attending: Family Medicine | Admitting: Family Medicine

## 2023-01-08 DIAGNOSIS — Z1231 Encounter for screening mammogram for malignant neoplasm of breast: Secondary | ICD-10-CM | POA: Insufficient documentation

## 2023-12-09 ENCOUNTER — Other Ambulatory Visit: Payer: Self-pay | Admitting: Family Medicine

## 2023-12-09 DIAGNOSIS — Z1231 Encounter for screening mammogram for malignant neoplasm of breast: Secondary | ICD-10-CM

## 2023-12-26 IMAGING — MG MM DIGITAL SCREENING BILAT W/ TOMO AND CAD
6 of 12 series · 6 of 36 positions shown · non-contrast
Comparison: Previous exam(s).

CLINICAL DATA: Screening.

EXAM:
DIGITAL SCREENING BILATERAL MAMMOGRAM WITH TOMOSYNTHESIS AND CAD
TECHNIQUE: Bilateral screening digital craniocaudal and mediolateral oblique
mammograms were obtained. Bilateral screening digital breast
tomosynthesis was performed. The images were evaluated with
computer-aided detection.

[L CC synth-2D]
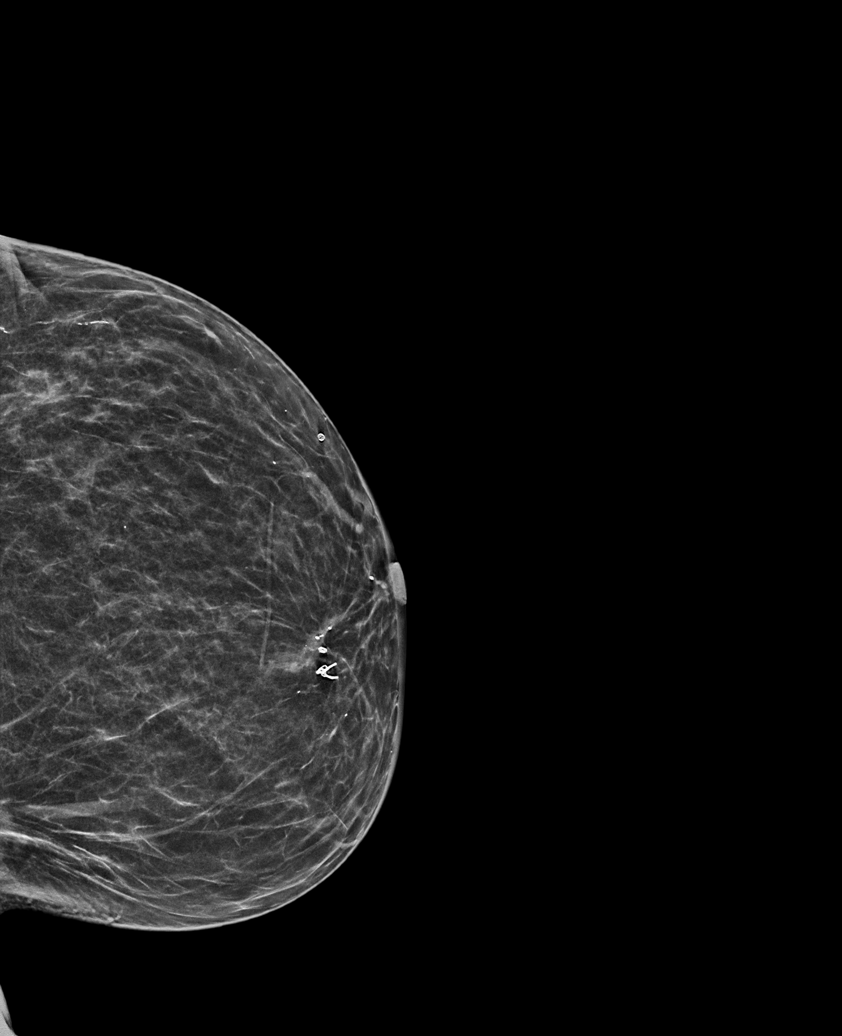

[L MLO synth-2D (1 of 2)]
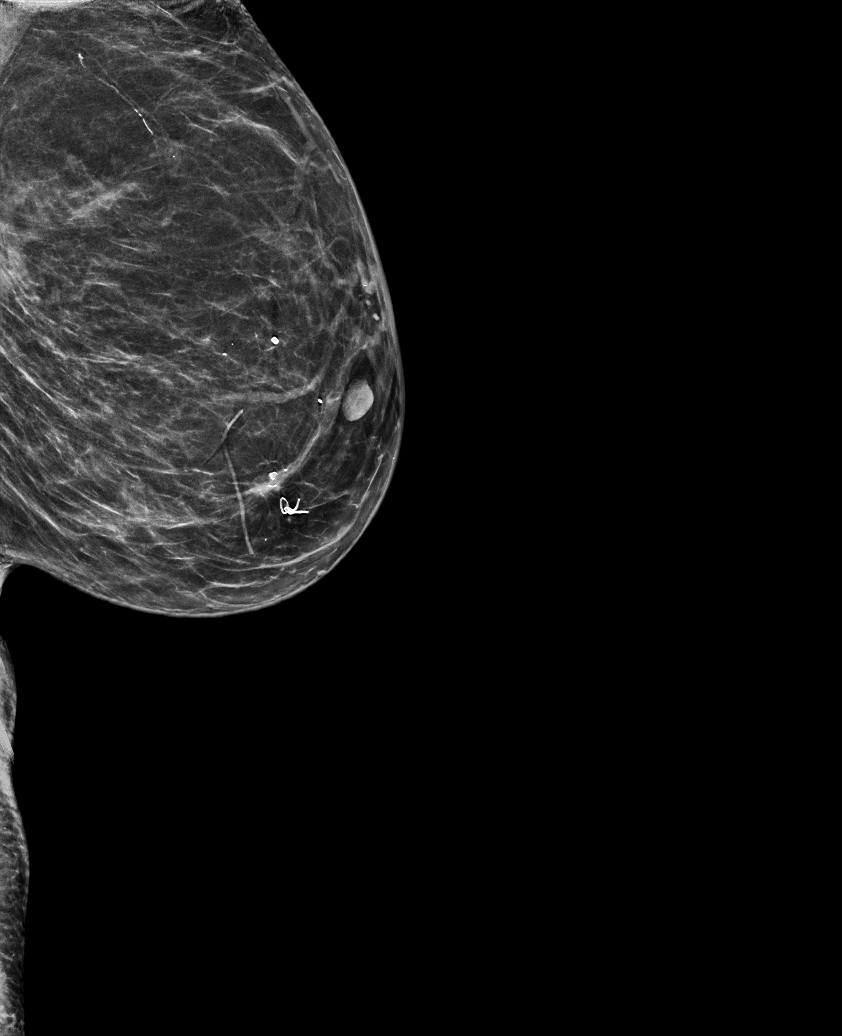

[L MLO synth-2D (2 of 2)]
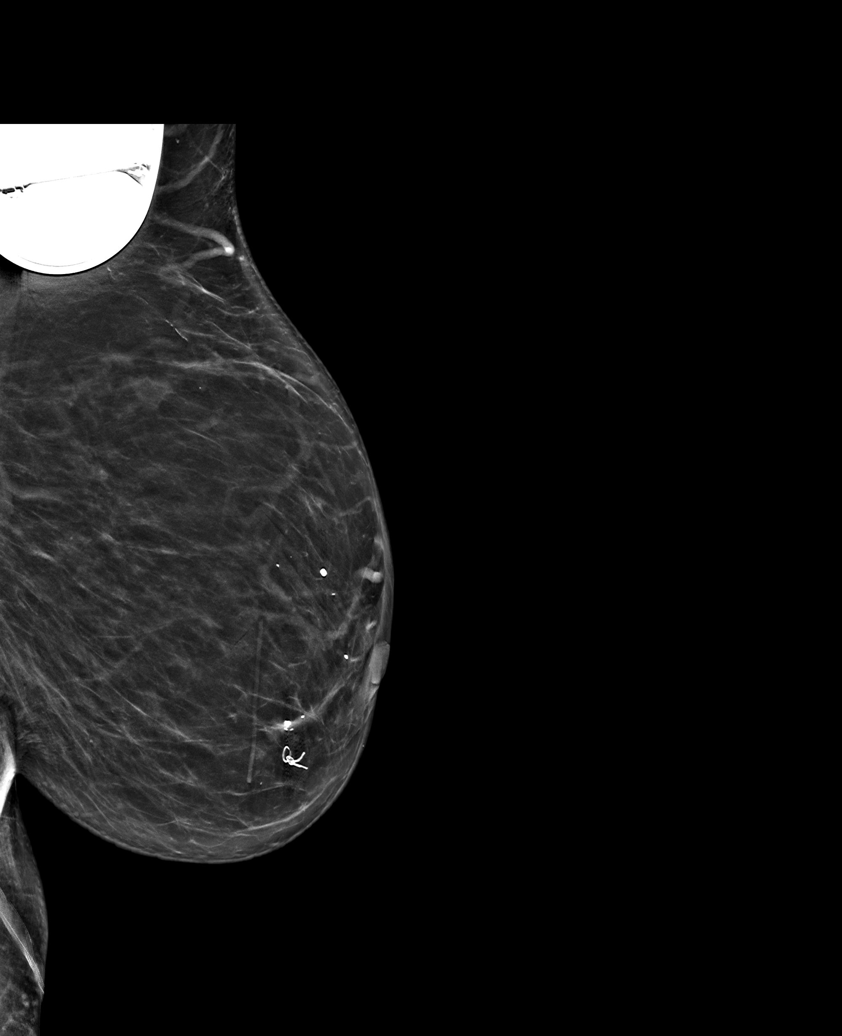

[R MLO synth-2D (1 of 2)]
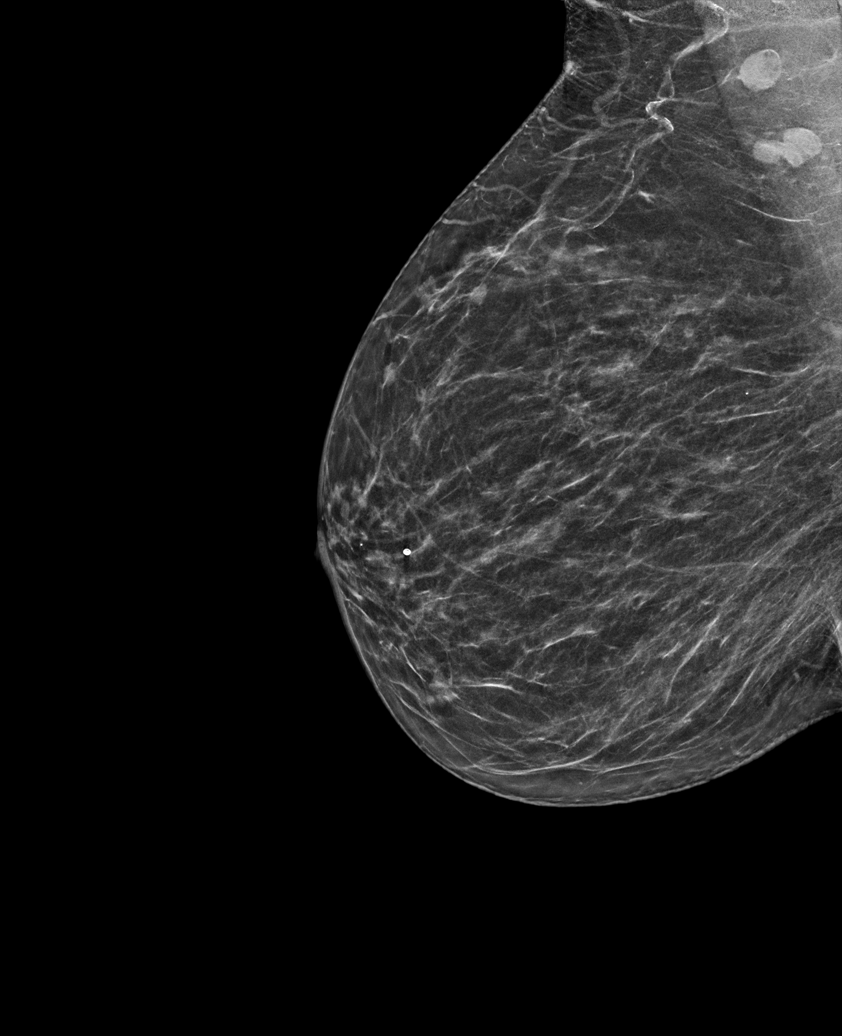

[R MLO synth-2D (2 of 2)]
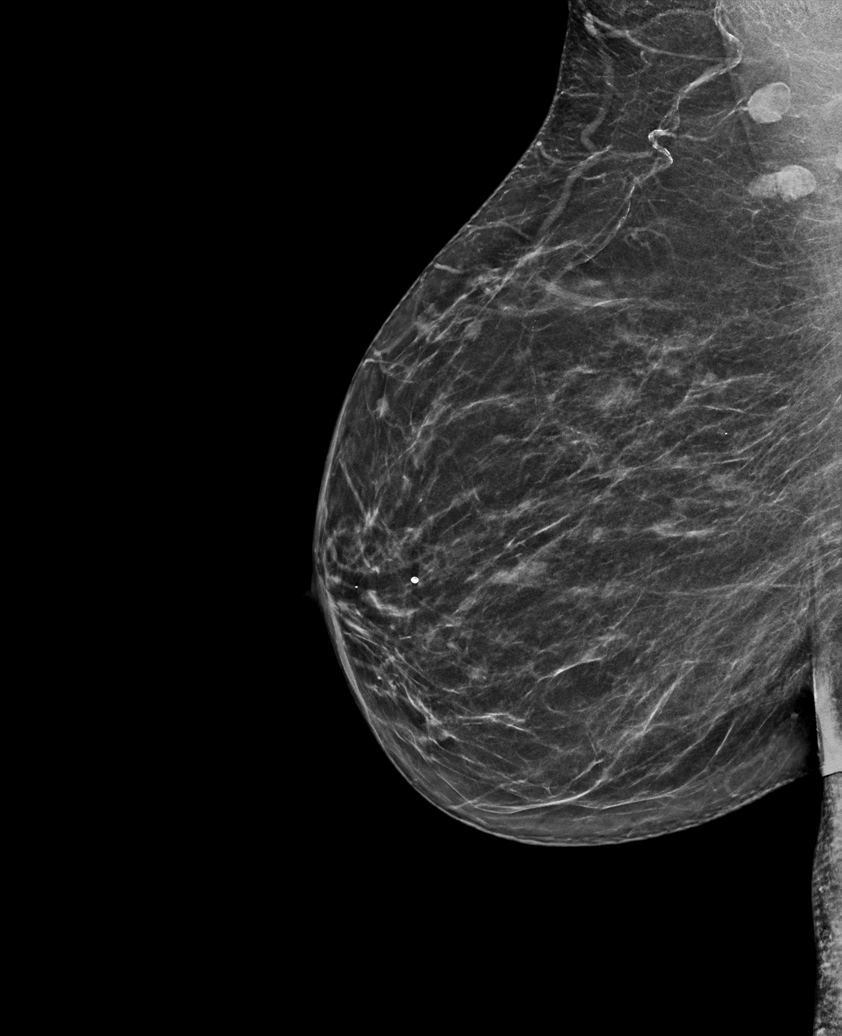

[R CC synth-2D]
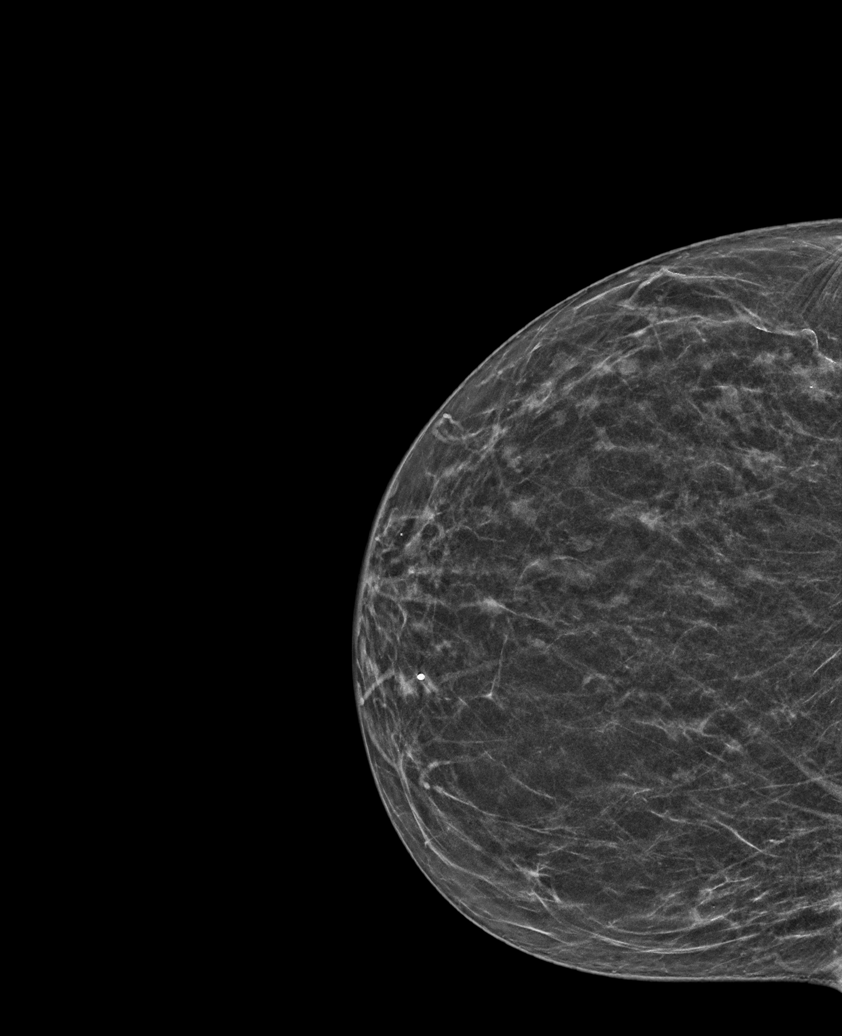

[6 of 36 positions shown; findings below may reference images not displayed]

ACR Breast Density Category b: There are scattered areas of
fibroglandular density.
FINDINGS: There are no findings suspicious for malignancy.
IMPRESSION: No mammographic evidence of malignancy. A result letter of this
screening mammogram will be mailed directly to the patient.

RECOMMENDATION:
Screening mammogram in one year. (Code:51-O-LD2)

BI-RADS CATEGORY  1: Negative.

## 2024-01-13 ENCOUNTER — Ambulatory Visit
Admission: RE | Admit: 2024-01-13 | Discharge: 2024-01-13 | Disposition: A | Discharge status: Home / Self Care | Source: Ambulatory Visit | Attending: Family Medicine | Admitting: Family Medicine

## 2024-01-13 DIAGNOSIS — Z1231 Encounter for screening mammogram for malignant neoplasm of breast: Secondary | ICD-10-CM | POA: Diagnosis present

## 2024-03-30 HISTORY — PX: OTHER SURGICAL HISTORY: SHX169

## 2024-04-13 ENCOUNTER — Encounter: Payer: Self-pay | Admitting: Cardiology

## 2024-04-13 ENCOUNTER — Other Ambulatory Visit: Payer: Self-pay

## 2024-04-13 ENCOUNTER — Encounter
Admission: RE | Disposition: A | Discharge status: Home / Self Care | Payer: Self-pay | Source: Home / Self Care | Attending: Cardiology

## 2024-04-13 ENCOUNTER — Ambulatory Visit
Admission: RE | Admit: 2024-04-13 | Discharge: 2024-04-13 | Disposition: A | Attending: Cardiology | Admitting: Cardiology

## 2024-04-13 DIAGNOSIS — Z7985 Long-term (current) use of injectable non-insulin antidiabetic drugs: Secondary | ICD-10-CM | POA: Diagnosis not present

## 2024-04-13 DIAGNOSIS — I110 Hypertensive heart disease with heart failure: Secondary | ICD-10-CM | POA: Diagnosis not present

## 2024-04-13 DIAGNOSIS — Z7982 Long term (current) use of aspirin: Secondary | ICD-10-CM | POA: Insufficient documentation

## 2024-04-13 DIAGNOSIS — Z4502 Encounter for adjustment and management of automatic implantable cardiac defibrillator: Secondary | ICD-10-CM | POA: Diagnosis present

## 2024-04-13 DIAGNOSIS — Z7984 Long term (current) use of oral hypoglycemic drugs: Secondary | ICD-10-CM | POA: Insufficient documentation

## 2024-04-13 DIAGNOSIS — I5022 Chronic systolic (congestive) heart failure: Secondary | ICD-10-CM | POA: Diagnosis not present

## 2024-04-13 DIAGNOSIS — Z8679 Personal history of other diseases of the circulatory system: Secondary | ICD-10-CM | POA: Diagnosis not present

## 2024-04-13 DIAGNOSIS — E782 Mixed hyperlipidemia: Secondary | ICD-10-CM | POA: Diagnosis not present

## 2024-04-13 DIAGNOSIS — E119 Type 2 diabetes mellitus without complications: Secondary | ICD-10-CM | POA: Insufficient documentation

## 2024-04-13 DIAGNOSIS — Z79899 Other long term (current) drug therapy: Secondary | ICD-10-CM | POA: Insufficient documentation

## 2024-04-13 DIAGNOSIS — Z45010 Encounter for checking and testing of cardiac pacemaker pulse generator [battery]: Secondary | ICD-10-CM

## 2024-04-13 HISTORY — PX: PPM GENERATOR CHANGEOUT: EP1233

## 2024-04-13 LAB — GLUCOSE, CAPILLARY: Glucose-Capillary: 173 mg/dL — ABNORMAL HIGH (ref 70–99)

## 2024-04-13 SURGERY — PPM GENERATOR CHANGEOUT
Anesthesia: Moderate Sedation

## 2024-04-13 MED ORDER — CEPHALEXIN 500 MG PO CAPS: 500.0000 mg | ORAL_CAPSULE | Freq: Four times a day (QID) | ORAL | 0 refills | Status: AC

## 2024-04-13 MED ORDER — CEFAZOLIN SODIUM-DEXTROSE 2-4 GM/100ML-% IV SOLN
INTRAVENOUS | Status: AC
  Filled 2024-04-13: qty 100

## 2024-04-13 MED ORDER — ONDANSETRON HCL 4 MG/2ML IJ SOLN: 4.0000 mg | Freq: Four times a day (QID) | INTRAMUSCULAR | Status: DC | PRN

## 2024-04-13 MED ORDER — SODIUM CHLORIDE 0.9 % IV SOLN
INTRAVENOUS | Status: DC | PRN
  Administered 2024-04-13: 80 mg

## 2024-04-13 MED ORDER — POVIDONE-IODINE 10 % EX SWAB
2.0000 | Freq: Once | CUTANEOUS | Status: AC
  Administered 2024-04-13: 2 via TOPICAL

## 2024-04-13 MED ORDER — SODIUM CHLORIDE 0.9 % IV SOLN: INTRAVENOUS | Status: DC

## 2024-04-13 MED ORDER — LIDOCAINE HCL (PF) 1 % IJ SOLN
INTRAMUSCULAR | Status: DC | PRN
  Administered 2024-04-13: 30 mL

## 2024-04-13 MED ORDER — FENTANYL CITRATE (PF) 100 MCG/2ML IJ SOLN
INTRAMUSCULAR | Status: AC
  Filled 2024-04-13: qty 2

## 2024-04-13 MED ORDER — MIDAZOLAM HCL 2 MG/2ML IJ SOLN
INTRAMUSCULAR | Status: AC
  Filled 2024-04-13: qty 2

## 2024-04-13 MED ORDER — CEFAZOLIN (ANCEF) 1 G IV SOLR
INTRAVENOUS | Status: DC | PRN
  Administered 2024-04-13: 2 g

## 2024-04-13 MED ORDER — ACETAMINOPHEN 325 MG PO TABS: 325.0000 mg | ORAL_TABLET | ORAL | Status: DC | PRN

## 2024-04-13 MED ORDER — CHLORHEXIDINE GLUCONATE CLOTH 2 % EX PADS
6.0000 | MEDICATED_PAD | Freq: Every day | CUTANEOUS | Status: DC
  Administered 2024-04-13: 6 via TOPICAL

## 2024-04-13 MED ORDER — LIDOCAINE HCL 1 % IJ SOLN
INTRAMUSCULAR | Status: AC
  Filled 2024-04-13: qty 20

## 2024-04-13 MED ORDER — FENTANYL CITRATE (PF) 100 MCG/2ML IJ SOLN
INTRAMUSCULAR | Status: DC | PRN
  Administered 2024-04-13: 25 ug via INTRAVENOUS

## 2024-04-13 MED ORDER — MIDAZOLAM HCL (PF) 2 MG/2ML IJ SOLN
INTRAMUSCULAR | Status: DC | PRN
  Administered 2024-04-13: 1 mg via INTRAVENOUS

## 2024-04-13 MED ORDER — LIDOCAINE HCL 1 % IJ SOLN
INTRAMUSCULAR | Status: AC
Start: 2024-04-13 — End: 2024-04-13
  Filled 2024-04-13: qty 40

## 2024-04-13 MED ORDER — CEFAZOLIN SODIUM-DEXTROSE 2-4 GM/100ML-% IV SOLN: 2.0000 g | INTRAVENOUS | Status: DC

## 2024-04-13 MED ORDER — SODIUM CHLORIDE 0.9 % IV SOLN
80.0000 mg | INTRAVENOUS | Status: DC
  Filled 2024-04-13: qty 2

## 2024-04-13 SURGICAL SUPPLY — 12 items
CABLE SURG 12 DISP A/V CHANNEL (MISCELLANEOUS) IMPLANT
DEVICE DSSCT PLSMBLD 3.0S LGHT (MISCELLANEOUS) IMPLANT
DRAPE INCISE 23X17 STRL (DRAPES) IMPLANT
ELECTRODE REM PT RTRN 9FT ADLT (ELECTROSURGICAL) IMPLANT
ICD COBALT XT CRT DTPA2D1 (ICD Generator) IMPLANT
KIT SYRINGE INJ CVI SPIKEX1 (MISCELLANEOUS) IMPLANT
PAD ELECT DEFIB RADIOL ZOLL (MISCELLANEOUS) IMPLANT
POUCH AIGIS-R ANTIBACT ICD LRG (Mesh General) IMPLANT
SUT VIC AB 2-0 CT2 27 (SUTURE) IMPLANT
SUT VIC AB 4-0 PS2 18 (SUTURE) IMPLANT
TRAY PACEMAKER INSERTION (PACKS) ×1 IMPLANT
TUBING CONNECTING 10 (TUBING) IMPLANT

## 2024-04-13 NOTE — Discharge Instructions (Addendum)
 Patient may shower 04/15/2024.  May remove outer bandage after shower, leave Steri-Strips on.Pacemaker Battery Change, Care After This sheet gives you information about how to care for yourself after your procedure. Your health care provider may also give you more specific instructions. If you have problems or questions, contact your health care provider. What can I expect after the procedure? After the procedure, it is common to have these symptoms at the site where the pacemaker was inserted: Mild pain or soreness. Slight bruising. Some swelling over the incisions. A slight bump over the skin where the device was placed (if it was implanted in the upper chest area). Sometimes, it is possible to feel the device under the skin. This is normal. Follow these instructions at home: Incision care  Keep the incision clean and dry for 2-3 days after the procedure or as told by your health care provider. It takes several weeks for the incision site to completely heal. Do not remove the bandage (dressing) on your chest until told to do so by your health care provider. Leave stitches (sutures), skin glue, or adhesive strips in place. These skin closures may need to stay in place for 2 weeks or longer. If adhesive strip edges start to loosen and curl up, you may trim the loose edges. Do not remove adhesive strips completely unless your health care provider tells you to do that. You may shower in 2 days Pat the incision area dry with a clean towel. Do not rub the area. This may cause bleeding. Check your incision area every day for signs of infection. Check for: More redness, swelling, or pain. Fluid or blood. Warmth. Pus or a bad smell. Avoid putting pressure on the area where the pacemaker was placed. Women may want to place a small pad over the incision site to protect it from their bra strap. Avoid using lotion on skin until insertion site heals completely Medicines Take over-the-counter and  prescription medicines only as told by your health care provider. If you were prescribed an antibiotic medicine, take it as told by your health care provider. Do not stop taking the antibiotic even if you start to feel better. Activity For the first 2 weeks, or as long as told by your health care provider: Avoid lifting anything heavy Avoid exercise or activities that take a lot of effort. If you were given a medicine to help you relax (sedative) during the procedure, it can affect you for several hours. Do not drive or operate machinery until your health care provider says that it is safe. General instructions Do not use any products that contain nicotine or tobacco, such as cigarettes, e-cigarettes, and chewing tobacco. These can delay incision healing after surgery. If you need help quitting, ask your health care provider. Always let all health care providers, including dentists, know about your pacemaker before you have any medical procedures or tests. You may be shown how to transfer data from your pacemaker through the phone to your health care provider. Wear a medical ID bracelet or necklace stating that you have a pacemaker, and carry a pacemaker ID card with you at all times. Avoid close and prolonged exposure to electrical devices that have strong magnetic fields. These include: Insurance claims handler. When at the airport, let officials know that you have a pacemaker. Carry your pacemaker ID card. Metal detectors. If you must pass through a metal detector, walk through it quickly. Do not stop under the detector or stand near it. When using your  mobile phone, hold it to the ear opposite the pacemaker. Do not leave your mobile phone in a pocket over the pacemaker. Your pacemaker battery will last for 5-15 years. Your health care provider will do routine checks to know when the battery is starting to run down. When this happens, the pacemaker will need to be replaced. Keep all follow-up  visits as told by your health care provider. This is important. Contact a health care provider if: You have pain at the incision site that is not relieved by medicines. You have any of these signs of infection: More redness, swelling, or pain around your incision. Fluid or blood coming from your incision. Warmth coming from your incision. Pus or a bad smell coming from your incision. A fever. You feel brief, occasional palpitations, light-headedness, or any symptoms that you think might be related to your heart. Get help right away if: You have chest pain that is different from the pain at the pacemaker site. You develop a red streak that extends above or below the incision site. You have shortness of breath. You have palpitations or an irregular heartbeat. You have light-headedness that does not go away quickly. You faint or have dizzy spells. Your pulse suddenly drops or increases rapidly and does not return to normal. You gain weight and your legs and ankles swell. Summary After the procedure, it is common to have pain, soreness, and some swelling or bruising where the pacemaker was inserted. Keep your incision clean and dry. Follow instructions from your health care provider about how to take care of your incision. Check your incision every day for signs of infection, such as more pain or swelling, pus or a bad smell, warmth, or leaking fluid or blood. Carry a pacemaker ID card with you at all times. This information is not intended to replace advice given to you by your health care provider. Make sure you discuss any questions you have with your health care provider. Document Revised: 05/13/2019 Document Reviewed: 05/13/2019 Elsevier Patient Education  2023 [employer].
